# Patient Record
Sex: Male | Born: 2000 | Race: Black or African American | Hispanic: No | Marital: Single | Smoking: Never smoker
Health system: Southern US, Community
[De-identification: ages and names within clinical notes are randomized; demographics above are authoritative.]

---
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
## 2020-05-20 ENCOUNTER — Encounter (HOSPITAL_COMMUNITY): Payer: Self-pay

## 2020-05-20 ENCOUNTER — Other Ambulatory Visit: Payer: Self-pay

## 2020-05-20 ENCOUNTER — Emergency Department (HOSPITAL_COMMUNITY)
Admission: EM | Admit: 2020-05-20 | Discharge: 2020-05-20 | Disposition: A | Discharge status: Home / Self Care | Payer: Medicaid Other | Attending: Emergency Medicine | Admitting: Emergency Medicine

## 2020-05-20 DIAGNOSIS — R531 Weakness: Secondary | ICD-10-CM | POA: Insufficient documentation

## 2020-05-20 DIAGNOSIS — R0981 Nasal congestion: Secondary | ICD-10-CM | POA: Diagnosis not present

## 2020-05-20 DIAGNOSIS — R059 Cough, unspecified: Secondary | ICD-10-CM | POA: Diagnosis not present

## 2020-05-20 DIAGNOSIS — J029 Acute pharyngitis, unspecified: Secondary | ICD-10-CM | POA: Diagnosis not present

## 2020-05-20 DIAGNOSIS — Z20822 Contact with and (suspected) exposure to covid-19: Secondary | ICD-10-CM | POA: Insufficient documentation

## 2020-05-20 DIAGNOSIS — J069 Acute upper respiratory infection, unspecified: Secondary | ICD-10-CM

## 2020-05-20 LAB — POC SARS CORONAVIRUS 2 AG -  ED: SARS Coronavirus 2 Ag: NEGATIVE

## 2020-05-20 MED ORDER — AMOXICILLIN 500 MG PO CAPS: 500.0000 mg | ORAL_CAPSULE | Freq: Three times a day (TID) | ORAL | 0 refills | Status: DC

## 2020-05-20 NOTE — ED Provider Notes (Signed)
[facility]EMERGENCY DEPT Provider Note   CSN: [id number] Arrival date & time: 05/20/20  1552     History Chief Complaint  Patient presents with  . Nasal Congestion  . Weakness    Calvin Martinez is a 20 y.o. male.  Patient with nasal congestion and cough and sore throat.  The history is provided by medical records and the patient. No language interpreter was used.  Weakness Severity:  Mild Onset quality:  Sudden Timing:  Constant Progression:  Worsening Chronicity:  New Context: not alcohol use   Relieved by:  Nothing Worsened by:  Nothing Associated symptoms: no abdominal pain, no chest pain, no cough, no diarrhea, no frequency, no headaches and no seizures        History reviewed. No pertinent past medical history.  There are no problems to display for this patient.   History reviewed. No pertinent surgical history.     Family History  Problem Relation Age of Onset  . Healthy Mother   . Healthy Father     Social History   Tobacco Use  . Smoking status: Never Smoker  . Smokeless tobacco: Never Used  Vaping Use  . Vaping Use: Never used  Substance Use Topics  . Alcohol use: Never  . Drug use: Never    Home Medications Prior to Admission medications   Medication Sig Start Date End Date Taking? Authorizing Provider  amoxicillin (AMOXIL) 500 MG capsule Take 1 capsule (500 mg total) by mouth 3 (three) times daily. 05/20/20  Yes Bethann Berkshire, MD    Allergies    Patient has no known allergies.  Review of Systems   Review of Systems  Constitutional: Negative for appetite change and fatigue.  HENT: Positive for congestion. Negative for ear discharge and sinus pressure.        Sore throat  Eyes: Negative for discharge.  Respiratory: Negative for cough.   Cardiovascular: Negative for chest pain.  Gastrointestinal: Negative for abdominal pain and diarrhea.  Genitourinary: Negative for frequency and hematuria.   Musculoskeletal: Negative for back pain.  Skin: Negative for rash.  Neurological: Positive for weakness. Negative for seizures and headaches.  Psychiatric/Behavioral: Negative for hallucinations.    Physical Exam Updated Vital Signs BP 122/69   Pulse 60   Temp 98.9 F (37.2 C) (Oral)   Resp 18   Ht 6\' 2"  (1.88 m)   Wt 74.8 kg   SpO2 100%   BMI 21.18 kg/m   Physical Exam Vitals and nursing note reviewed.  Constitutional:      Appearance: He is well-developed.  HENT:     Head: Normocephalic.     Nose: Nose normal.  Eyes:     General: No scleral icterus.    Conjunctiva/sclera: Conjunctivae normal.  Neck:     Thyroid: No thyromegaly.  Cardiovascular:     Rate and Rhythm: Normal rate and regular rhythm.     Heart sounds: No murmur heard. No friction rub. No gallop.   Pulmonary:     Breath sounds: No stridor. No wheezing or rales.  Chest:     Chest wall: No tenderness.  Abdominal:     General: There is no distension.     Tenderness: There is no abdominal tenderness. There is no rebound.  Musculoskeletal:        General: Normal range of motion.     Cervical back: Neck supple.  Lymphadenopathy:     Cervical: No cervical adenopathy.  Skin:    Findings: No erythema  or rash.  Neurological:     Mental Status: He is alert and oriented to person, place, and time.     Motor: No abnormal muscle tone.     Coordination: Coordination normal.  Psychiatric:        Behavior: Behavior normal.     ED Results / Procedures / Treatments   Labs (all labs ordered are listed, but only abnormal results are displayed) Labs Reviewed  SARS CORONAVIRUS 2 (TAT 6-24 HRS)  POC SARS CORONAVIRUS 2 AG -  ED    EKG None  Radiology No results found.  Procedures Procedures   Medications Ordered in ED Medications - No data to display  ED Course  I have reviewed the triage vital signs and the nursing notes.  Pertinent labs & imaging results that were available during my care of  the patient were reviewed by me and considered in my medical decision making (see chart for details).    MDM Rules/Calculators/A&P                          Sinusitis.  Initial COVID test antigen negative.  We will do the 24-hour COVID.  Patient will follow up with me Final Clinical Impression(s) / ED Diagnoses Final diagnoses:  None    Rx / DC Orders ED Discharge Orders         Ordered    amoxicillin (AMOXIL) 500 MG capsule  3 times daily        05/20/20 1658           Bethann Berkshire, MD 05/20/20 1702

## 2020-05-20 NOTE — Discharge Instructions (Addendum)
During plenty of fluids take Tylenol for pain and follow-up as needed

## 2020-05-20 NOTE — ED Notes (Signed)
An After Visit Summary was printed and given to the patient. Discharge instructions given and no further questions at this time.  

## 2020-05-20 NOTE — ED Triage Notes (Signed)
Pt c/o lightheadedness and dizziness. Pt c/o nasal congestion. Pt states this started yesterday.

## 2020-05-21 LAB — SARS CORONAVIRUS 2 (TAT 6-24 HRS): SARS Coronavirus 2: NEGATIVE

## 2020-05-24 ENCOUNTER — Other Ambulatory Visit: Payer: Self-pay

## 2020-05-24 ENCOUNTER — Encounter (HOSPITAL_COMMUNITY): Payer: Self-pay | Admitting: *Deleted

## 2020-05-24 ENCOUNTER — Emergency Department (HOSPITAL_COMMUNITY)
Admission: EM | Admit: 2020-05-24 | Discharge: 2020-05-24 | Disposition: A | Discharge status: Home / Self Care | Payer: Medicaid Other | Attending: Emergency Medicine | Admitting: Emergency Medicine

## 2020-05-24 ENCOUNTER — Emergency Department (HOSPITAL_COMMUNITY): Payer: Medicaid Other

## 2020-05-24 DIAGNOSIS — R0602 Shortness of breath: Secondary | ICD-10-CM | POA: Insufficient documentation

## 2020-05-24 DIAGNOSIS — R5383 Other fatigue: Secondary | ICD-10-CM | POA: Diagnosis not present

## 2020-05-24 DIAGNOSIS — R42 Dizziness and giddiness: Secondary | ICD-10-CM | POA: Diagnosis present

## 2020-05-24 LAB — COMPREHENSIVE METABOLIC PANEL
ALT: 15 U/L (ref 0–44)
AST: 26 U/L (ref 15–41)
Albumin: 4.3 g/dL (ref 3.5–5.0)
Alkaline Phosphatase: 58 U/L (ref 38–126)
Anion gap: 8 (ref 5–15)
BUN: 16 mg/dL (ref 6–20)
CO2: 27 mmol/L (ref 22–32)
Calcium: 9 mg/dL (ref 8.9–10.3)
Chloride: 102 mmol/L (ref 98–111)
Creatinine, Ser: 0.79 mg/dL (ref 0.61–1.24)
GFR, Estimated: >60 mL/min (ref >60)
Glucose, Bld: 96 mg/dL (ref 70–99)
Potassium: 4.2 mmol/L (ref 3.5–5.1)
Sodium: 137 mmol/L (ref 135–145)
Total Bilirubin: 1 mg/dL (ref 0.3–1.2)
Total Protein: 7.2 g/dL (ref 6.5–8.1)

## 2020-05-24 LAB — CBC WITH DIFFERENTIAL/PLATELET
Abs Immature Granulocytes: 0 10*3/uL (ref 0.00–0.07)
Basophils Absolute: 0 10*3/uL (ref 0.0–0.1)
Basophils Relative: 0 %
Eosinophils Absolute: 0.1 10*3/uL (ref 0.0–0.5)
Eosinophils Relative: 1 %
HCT: 41.6 % (ref 39.0–52.0)
Hemoglobin: 13.3 g/dL (ref 13.0–17.0)
Immature Granulocytes: 0 %
Lymphocytes Relative: 28 %
Lymphs Abs: 1.5 10*3/uL (ref 0.7–4.0)
MCH: 26.4 pg (ref 26.0–34.0)
MCHC: 32 g/dL (ref 30.0–36.0)
MCV: 82.5 fL (ref 80.0–100.0)
Monocytes Absolute: 0.3 10*3/uL (ref 0.1–1.0)
Monocytes Relative: 7 %
Neutro Abs: 3.3 10*3/uL (ref 1.7–7.7)
Neutrophils Relative %: 64 %
Platelets: 206 10*3/uL (ref 150–400)
RBC: 5.04 MIL/uL (ref 4.22–5.81)
RDW: 13.3 % (ref 11.5–15.5)
WBC: 5.2 10*3/uL (ref 4.0–10.5)
nRBC: 0 % (ref 0.0–0.2)

## 2020-05-24 LAB — D-DIMER, QUANTITATIVE: D-Dimer, Quant: <0.27 ug/mL-FEU (ref 0.00–0.50)

## 2020-05-24 NOTE — ED Triage Notes (Addendum)
BIB EMS while at work felt dizzy and light headed. Seen on the 18th for congestion and same. Presently taking Amoxicillin. 113/71-56-16-99% RA CBG 102

## 2020-05-24 NOTE — ED Provider Notes (Signed)
[facility]-EMER[facility]PT Provider Note   CSN: [id number] Arrival date & time: 05/24/20  1224     History Chief Complaint  Patient presents with  . Dizziness    Calvin Martinez is a 20 y.o. male.  20 y.o male with no PMH presents to the ED via EMS with a chief complaint of dizziness x today. Patient was evaluated in the ED around 4 days ago and diagnosed with sinusitis, he was given a prescription for amoxicillin which he is currently taking. 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  Reports while he was in the EMS ambulance he did not feel he could gather his thoughts, had a hard time with his speech, felt like he was unaware of where he was not very confused.  Ports no prior symptoms like these in the past.  Denies any drug use, alcohol use, chest pain.  Does report he was placed on antibiotics about 4 days ago, he was having some shortness of breath and this has continued, this is worse with exertion.  No prior history of blood clots, no cough, no other symptoms.      The history is provided by the patient.       History reviewed. No pertinent past medical history.  There are no problems to display for this patient.   History reviewed. No pertinent surgical history.     Family History  Problem Relation Age of Onset  . Healthy Mother   . Healthy Father     Social History   Tobacco Use  . Smoking status: Never Smoker  . Smokeless tobacco: Never Used  Vaping Use  . Vaping Use: Never used  Substance Use Topics  . Alcohol use: Never  . Drug use: Never    Home Medications Prior to Admission medications   Medication Sig Start Date End Date Taking? Authorizing Provider  amoxicillin (AMOXIL) 500 MG capsule Take 1 capsule (500 mg total) by mouth 3 (three) times daily. 05/20/20   Bethann Berkshire, MD     Allergies    Patient has no known allergies.  Review of Systems   Review of Systems  Constitutional: Positive for fatigue. Negative for fever.  HENT: Positive for sinus pressure and sore throat.   Respiratory: Positive for shortness of breath.   Cardiovascular: Negative for chest pain.  Gastrointestinal: Negative for abdominal pain, diarrhea, nausea and vomiting.  All other systems reviewed and are negative.   Physical Exam Updated Vital Signs BP 105/73   Pulse (!) 52   Temp 98.1 F (36.7 C) (Oral)   Resp 14   Wt 74.9 kg   SpO2 97%   BMI 21.20 kg/m   Physical Exam Vitals and nursing note reviewed.  Constitutional:      Appearance: Normal appearance.  HENT:     Head: Normocephalic and atraumatic.     Mouth/Throat:     Mouth: Mucous membranes are moist.  Eyes:     Pupils: Pupils are equal, round, and reactive to light.  Cardiovascular:     Rate and Rhythm: Normal rate.  Abdominal:     General: Abdomen is flat.  Musculoskeletal:     Cervical back: Normal range of motion and neck supple.  Skin:    General: Skin is warm and dry.  Neurological:     Mental Status: He is alert and oriented to  person, place, and time.     Comments: Alert, oriented, thought content appropriate. Speech fluent without evidence of aphasia. Able to follow 2 step commands without difficulty.  Cranial Nerves:  II:  Peripheral visual fields grossly normal, pupils, round, reactive to light III,IV, VI: ptosis not present, extra-ocular motions intact bilaterally  V,VII: smile symmetric, facial light touch sensation equal VIII: hearing grossly normal bilaterally  IX,X: midline uvula rise  XI: bilateral shoulder shrug equal and strong XII: midline tongue extension  Motor:  5/5 in upper and lower extremities bilaterally including strong and equal grip strength and dorsiflexion/plantar flexion Sensory: light touch normal in all extremities.  Cerebellar: normal finger-to-nose with bilateral  upper extremities, pronator drift negative Gait: normal gait and balance     ED Results / Procedures / Treatments   Labs (all labs ordered are listed, but only abnormal results are displayed) Labs Reviewed  CBC WITH DIFFERENTIAL/PLATELET  COMPREHENSIVE METABOLIC PANEL  D-DIMER, QUANTITATIVE    EKG EKG Interpretation  Date/Time:  Friday May 24 2020 13:35:05 EDT Ventricular Rate:  55 PR Interval:  176 QRS Duration: 94 QT Interval:  416 QTC Calculation: 398 R Axis:   78 Text Interpretation: Sinus rhythm Probable left ventricular hypertrophy ST elevation suggests acute pericarditis 12 Lead; Mason-Likar Confirmed by Marianna Fuss ([id number]) on 05/24/2020 2:31:50 PM   Radiology DG Chest 2 View  Result Date: 05/24/2020 CLINICAL DATA:  Shortness of breath. Dizziness. Confusion. Slurred speech. EXAM: CHEST - 2 VIEW COMPARISON:  None. FINDINGS: The heart size and mediastinal contours are within normal limits. Both lungs are clear. No evidence of pneumothorax or pleural effusion. The visualized skeletal structures are unremarkable. IMPRESSION: Normal study. Electronically Signed   By: Danae Orleans M.D.   On: 05/24/2020 13:27    Procedures Procedures   Medications Ordered in ED Medications - No data to display  ED Course  I have reviewed the triage vital signs and the nursing notes.  Pertinent labs & imaging results that were available during my care of the patient were reviewed by me and considered in my medical decision making (see chart for details).  Clinical Course as of 05/24/20 1437  Fri May 24, 2020  1404 D-Dimer, Quant: <0.27 [JS]    Clinical Course User Index [JS] Claude Manges, PA-C   MDM Rules/Calculators/A&P     Patient presents to the ED via EMS with a chief complaint of dizziness, lightheadedness that began while he was at work.  Patient was originally seen 4 days ago in the ED, diagnosed with sinusitis and given a prescription for amoxicillin.  He reports he  has been taking this medication daily, has been feeling like he has had some improvement.  Today was the first that he return to work, he reports symptoms subsided as he sat down.  Does feel somewhat he was confused while on the EMS.  Arrived in the ED with stable vital signs, normotensive, heart rate is 50, oxygen saturation is 95% on room air without any signs of tachypnea.  Does report he has felt worsening shortness of breath the last couple of days since his last ED visit, exacerbated with exertion.  Due to this being a second visit we will obtain further labs in order to evaluate worsening symptoms from his sinusitis. Lungs are somewhat diminished to auscultation, abdomen is soft, non tender to palpation, neuro exam is unremarkable, without any focal deficit on my exam.  He is ambulatory in the room with a steady gait with stable vital signs.  Interpretation of his labs showed a CBC which is unremarkable. CMP is without any electrolyte abnormality, creatine level is within normal limits,LFTs are within normal limits. Due to persistent SOB despite normal xray on today's visits. Will obtain dimer, which is negative on today's visits.  Patient was reevaluate by me with mild improvement in symptoms, He is without any further complain, we discussed strict return precautions. Patient stable for discharge.    Portions of this note were generated with Scientist, clinical (histocompatibility and immunogenetics). Dictation errors may occur despite best attempts at proofreading.  Final Clinical Impression(s) / ED Diagnoses Final diagnoses:  Dizziness    Rx / DC Orders ED Discharge Orders    None       Claude Manges, PA-C 05/24/20 1437    Milagros Loll, MD 05/24/20 [phone]

## 2020-05-24 NOTE — Discharge Instructions (Addendum)
Please continue taking your antibiotics as prescribed. You may continue to hydrate with plenty of fluids if you are experiencing a fever you can also take tylenol.  Follow up with your primary care physician as needed.

## 2020-05-24 NOTE — ED Notes (Signed)
Patient transported to X-ray 

## 2020-06-03 ENCOUNTER — Emergency Department (HOSPITAL_COMMUNITY)
Admission: EM | Admit: 2020-06-03 | Discharge: 2020-06-03 | Disposition: A | Discharge status: Home / Self Care | Payer: Medicaid Other | Attending: Emergency Medicine | Admitting: Emergency Medicine

## 2020-06-03 ENCOUNTER — Encounter (HOSPITAL_COMMUNITY): Payer: Self-pay

## 2020-06-03 ENCOUNTER — Other Ambulatory Visit: Payer: Self-pay

## 2020-06-03 DIAGNOSIS — J069 Acute upper respiratory infection, unspecified: Secondary | ICD-10-CM

## 2020-06-03 DIAGNOSIS — U071 COVID-19: Secondary | ICD-10-CM | POA: Diagnosis not present

## 2020-06-03 DIAGNOSIS — R059 Cough, unspecified: Secondary | ICD-10-CM | POA: Diagnosis present

## 2020-06-03 NOTE — ED Provider Notes (Signed)
[facility]-EMERGENCY DEPT Provider Note   CSN: [id number] Arrival date & time: 06/03/20  1626     History Chief Complaint  Patient presents with  . Diarrhea    Calvin Martinez is a 20 y.o. male.  Calvin Martinez , a 20 y.o. male  was evaluated in triage.  Pt complains of mild headache with cough, congestion, sore throat.  States last night he felt hot and cold all night long.  Feeling better overall today.  No known sick contacts.        History reviewed. No pertinent past medical history.  There are no problems to display for this patient.   History reviewed. No pertinent surgical history.     Family History  Problem Relation Age of Onset  . Healthy Mother   . Healthy Father     Social History   Tobacco Use  . Smoking status: Never Smoker  . Smokeless tobacco: Never Used  Vaping Use  . Vaping Use: Never used  Substance Use Topics  . Alcohol use: Never  . Drug use: Never    Home Medications Prior to Admission medications   Medication Sig Start Date End Date Taking? Authorizing Provider  amoxicillin (AMOXIL) 500 MG capsule Take 1 capsule (500 mg total) by mouth 3 (three) times daily. 05/20/20   Bethann Berkshire, MD    Allergies    Patient has no known allergies.  Review of Systems   Review of Systems  Constitutional: Positive for chills and diaphoresis.  HENT: Positive for congestion and sore throat. Negative for sinus pressure, sinus pain and sneezing.   Respiratory: Positive for cough.   Gastrointestinal: Negative for nausea and vomiting.  Musculoskeletal: Positive for arthralgias and myalgias.  Skin: Negative for rash and wound.  Allergic/Immunologic: Negative for immunocompromised state.  Neurological: Positive for headaches.  Hematological: Negative for adenopathy.  Psychiatric/Behavioral: Negative for confusion.  All other systems reviewed and are negative.   Physical Exam Updated Vital Signs BP (!) 115/59 (BP  Location: Left Arm)   Pulse 65   Temp 98.9 F (37.2 C) (Oral)   Resp 16   SpO2 100%   Physical Exam Vitals and nursing note reviewed.  Constitutional:      General: He is not in acute distress.    Appearance: He is well-developed. He is not diaphoretic.  HENT:     Head: Normocephalic and atraumatic.  Eyes:     Conjunctiva/sclera: Conjunctivae normal.  Cardiovascular:     Rate and Rhythm: Normal rate and regular rhythm.     Pulses: Normal pulses.     Heart sounds: Normal heart sounds.  Pulmonary:     Effort: Pulmonary effort is normal.     Breath sounds: Normal breath sounds.  Abdominal:     Palpations: Abdomen is soft.     Tenderness: There is no abdominal tenderness.  Musculoskeletal:     Cervical back: Neck supple.  Lymphadenopathy:     Cervical: No cervical adenopathy.  Skin:    General: Skin is warm and dry.     Findings: No erythema or rash.  Neurological:     Mental Status: He is alert and oriented to person, place, and time.  Psychiatric:        Behavior: Behavior normal.     ED Results / Procedures / Treatments   Labs (all labs ordered are listed, but only abnormal results are displayed) Labs Reviewed  RESP PANEL BY RT-PCR (FLU A&B, COVID) [id number]    EKG  None  Radiology No results found.  Procedures Procedures   Medications Ordered in ED Medications - No data to display  ED Course  I have reviewed the triage vital signs and the nursing notes.  Pertinent labs & imaging results that were available during my care of the patient were reviewed by me and considered in my medical decision making (see chart for details).  Clinical Course as of 06/03/20 Carver Fila Jun 03, 2020  2614 20 year old male presents with URI symptoms, improving compared to illness last night.  Patient is well-appearing on exam.  Plan is for COVID swab and discharge to follow-up with his results in his MyChart account.  Recommend symptomatic treatment at home. [LM]    Clinical  Course User Index [LM] Alden Hipp   MDM Rules/Calculators/A&P                          Final Clinical Impression(s) / ED Diagnoses Final diagnoses:  Viral URI    Rx / DC Orders ED Discharge Orders    None       Alden Hipp 06/03/20 Lenon Oms, MD 06/04/20 1031

## 2020-06-03 NOTE — Discharge Instructions (Addendum)
Home to quarantine pending COVID test results. Take Motrin and Tylenol as needed as directed.

## 2020-06-03 NOTE — ED Triage Notes (Signed)
Emergency Medicine Provider Triage Evaluation Note  Calvin Martinez Calvin Martinez , a 20 y.o. male  was evaluated in triage.  Pt complains of mild headache with cough, congestion, sore throat.  States last night he felt hot and cold all night long.  Feeling better overall today.  No known sick contacts.  Review of Systems  Positive: Fever, cough, congestion, sore throat Negative: Abdominal pain, vomiting, changes in bowel or bladder habits  Physical Exam  BP (!) 115/59 (BP Location: Left Arm)   Pulse 65   Temp 98.9 F (37.2 C) (Oral)   Resp 16   SpO2 100%  Gen:   Awake, no distress   HEENT:  Atraumatic  Resp:  Normal effort  Cardiac:  Normal rate  Abd:   Nondistended, nontender  MSK:   Moves extremities without difficulty  Neuro:  Speech clear   Medical Decision Making  Medically screening exam initiated at 5:21 PM.  Appropriate orders placed.  Calvin Martinez was informed that the remainder of the evaluation will be completed by another provider, this initial triage assessment does not replace that evaluation, and the importance of remaining in the ED until their evaluation is complete.  Clinical Impression     Calvin Fend, PA-C 06/03/20 1724

## 2020-06-03 NOTE — ED Triage Notes (Signed)
Pt reports diarrhea since last night. Reports thinking that he may have had a fever. Denies abdominal pain and vomiting.

## 2020-06-04 LAB — RESP PANEL BY RT-PCR (FLU A&B, COVID) ARPGX2
Influenza A by PCR: NEGATIVE
Influenza B by PCR: NEGATIVE
SARS Coronavirus 2 by RT PCR: POSITIVE — AB

## 2020-06-05 ENCOUNTER — Telehealth: Payer: Self-pay | Admitting: Unknown Physician Specialty

## 2020-06-05 NOTE — Telephone Encounter (Signed)
Called to discuss with patient about COVID-19 symptoms and the use of one of the available treatments for those with mild to moderate Covid symptoms and at a high risk of hospitalization.  Pt appears to qualify for outpatient treatment due to co-morbid conditions and/or a member of an at-risk group in accordance with the FDA Emergency Use Authorization.    Not interested in tx at this time  Calvin Martinez

## 2021-07-01 IMAGING — CR DG CHEST 2V
2 series · 2 of 2 positions shown · non-contrast
Comparison: None.

CLINICAL DATA: Shortness of breath. Dizziness. Confusion. Slurred
speech.

EXAM:
CHEST - 2 VIEW

[w chest lat]
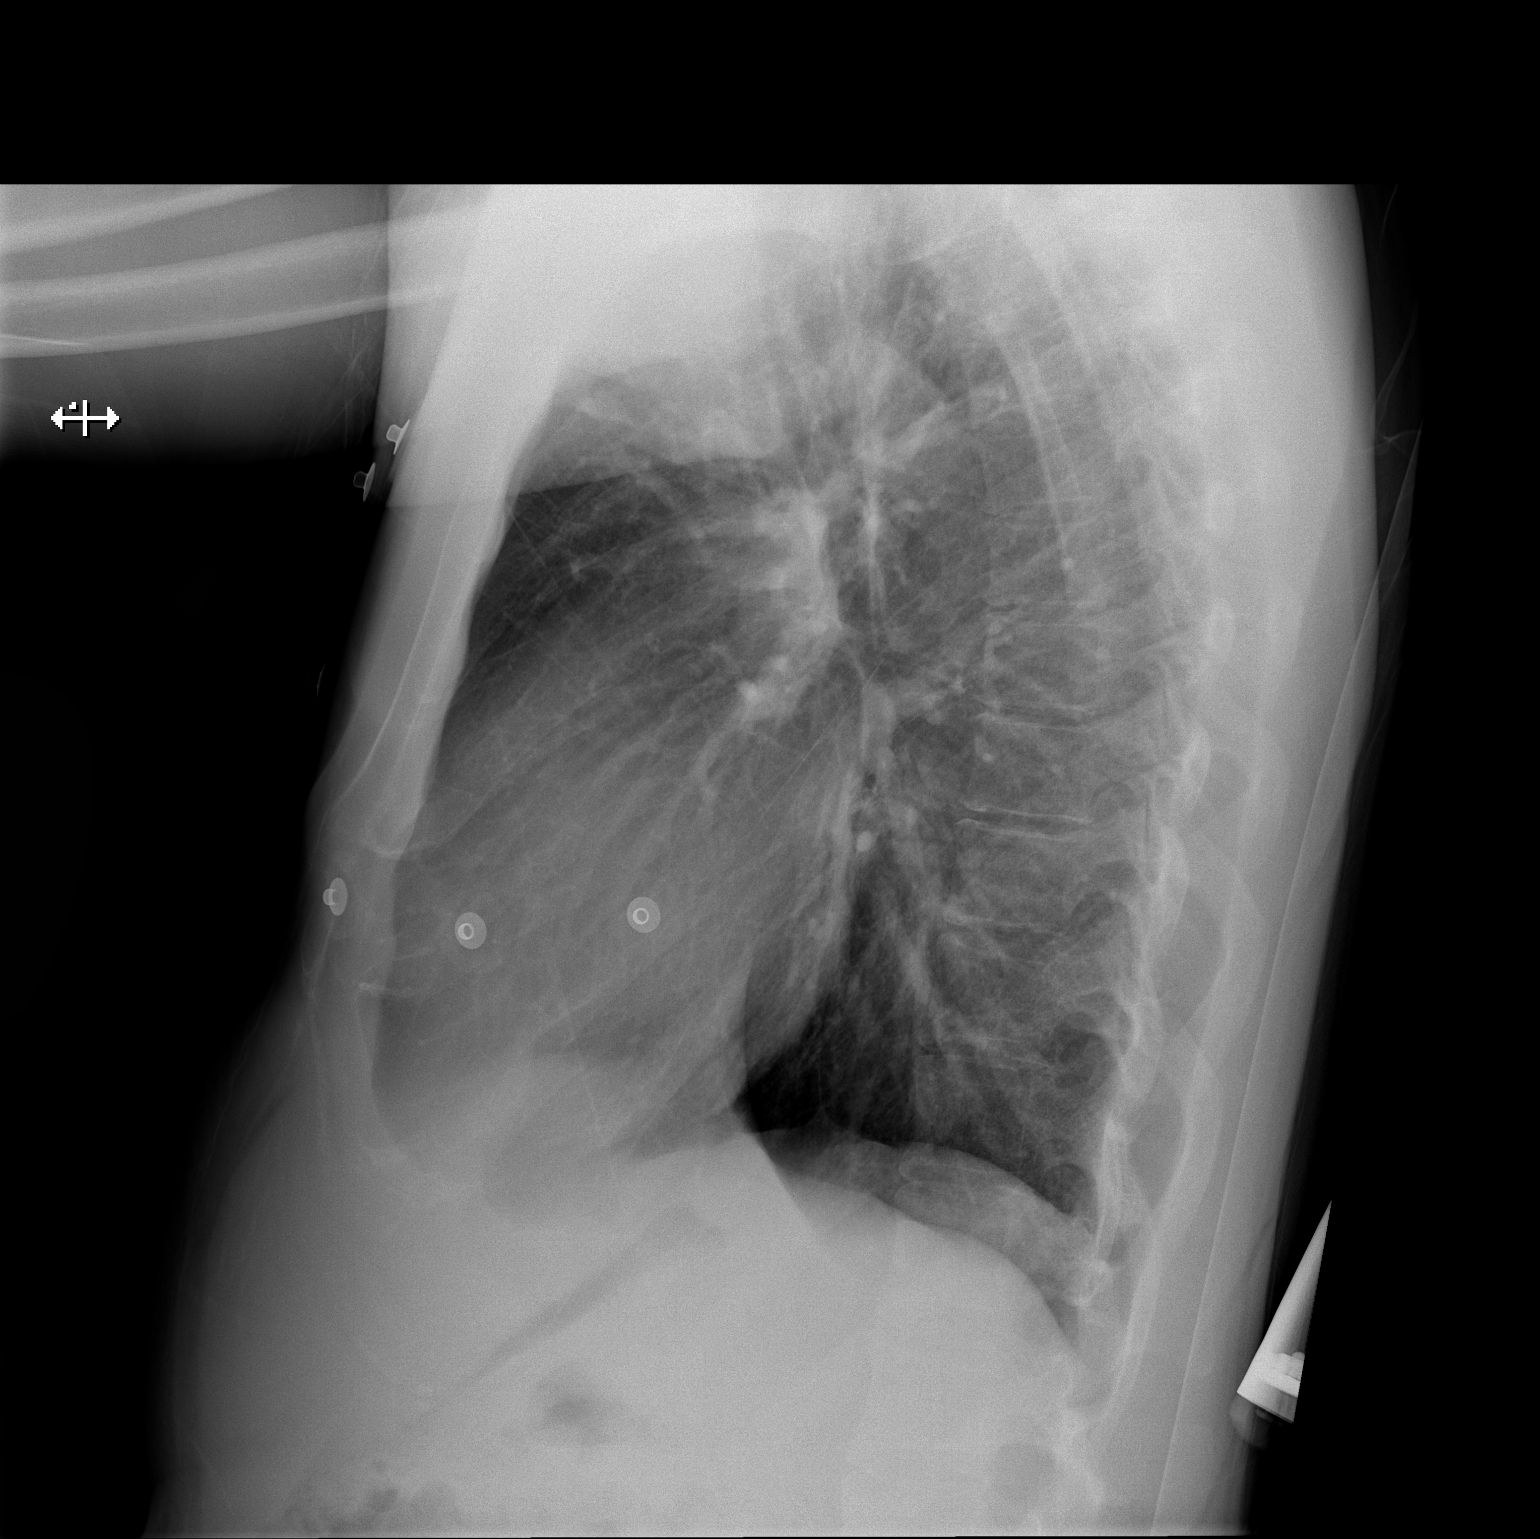

[x chest ap]
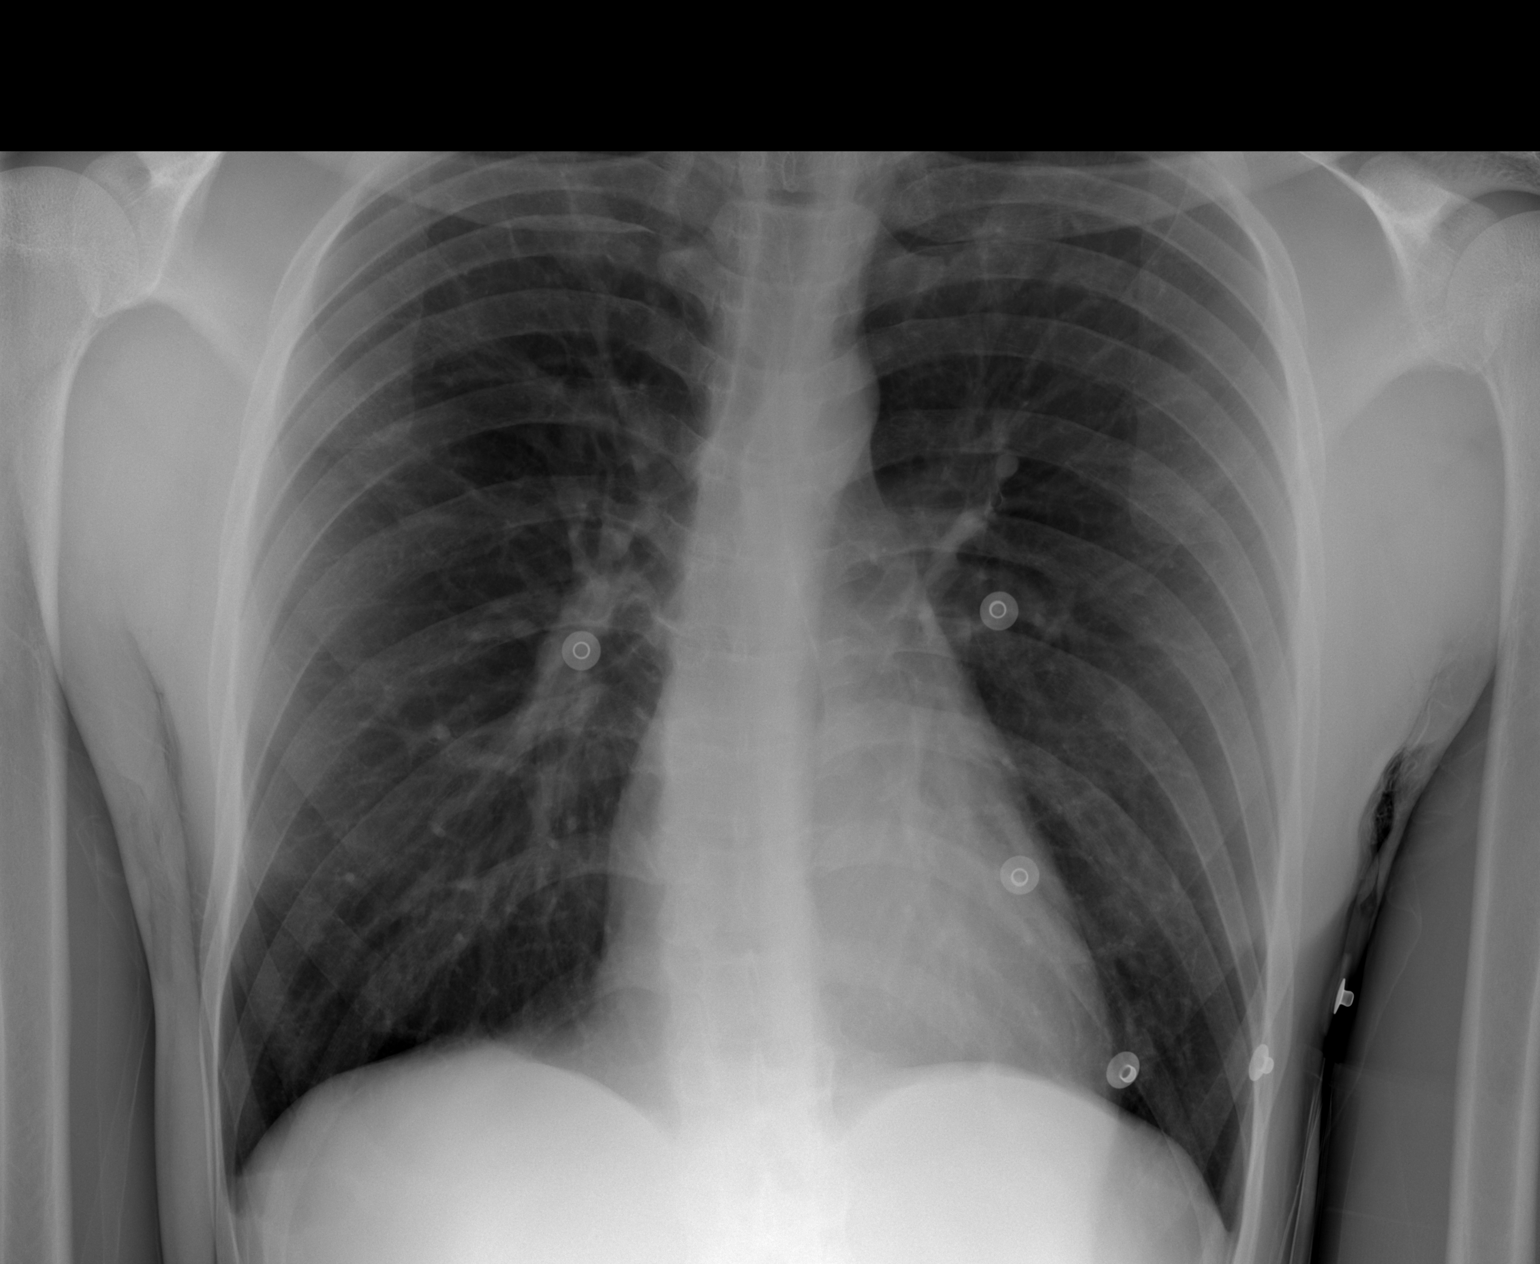

[2 of 2 positions shown; findings below may reference images not displayed]

FINDINGS: The heart size and mediastinal contours are within normal limits.
Both lungs are clear. No evidence of pneumothorax or pleural
effusion. The visualized skeletal structures are unremarkable.
IMPRESSION: Normal study.

## 2022-04-17 ENCOUNTER — Encounter (HOSPITAL_COMMUNITY): Payer: Self-pay

## 2022-04-17 ENCOUNTER — Ambulatory Visit (HOSPITAL_COMMUNITY)
Admission: EM | Admit: 2022-04-17 | Discharge: 2022-04-17 | Disposition: A | Discharge status: Home / Self Care | Payer: Medicaid Other | Attending: Internal Medicine | Admitting: Internal Medicine

## 2022-04-17 DIAGNOSIS — J029 Acute pharyngitis, unspecified: Secondary | ICD-10-CM

## 2022-04-17 DIAGNOSIS — Z1152 Encounter for screening for COVID-19: Secondary | ICD-10-CM | POA: Insufficient documentation

## 2022-04-17 DIAGNOSIS — J019 Acute sinusitis, unspecified: Secondary | ICD-10-CM | POA: Insufficient documentation

## 2022-04-17 DIAGNOSIS — B9689 Other specified bacterial agents as the cause of diseases classified elsewhere: Secondary | ICD-10-CM | POA: Diagnosis not present

## 2022-04-17 LAB — POCT RAPID STREP A, ED / UC: Streptococcus, Group A Screen (Direct): NEGATIVE

## 2022-04-17 LAB — POC INFLUENZA A AND B ANTIGEN (URGENT CARE ONLY)
INFLUENZA A ANTIGEN, POC: NEGATIVE
INFLUENZA B ANTIGEN, POC: NEGATIVE

## 2022-04-17 MED ORDER — FLUTICASONE PROPIONATE 50 MCG/ACT NA SUSP: 1.0000 | Freq: Every day | NASAL | 0 refills | Status: DC | PRN

## 2022-04-17 MED ORDER — CETIRIZINE HCL 10 MG PO TABS: 10.0000 mg | ORAL_TABLET | Freq: Every day | ORAL | 0 refills | Status: DC | PRN

## 2022-04-17 NOTE — Discharge Instructions (Signed)
Your flu test was negative. COVID was sent to the lab for further testing. Someone from our office will call you with your results.Your symptoms appear consistent with a viral respiratory illness. Recommend symptomatic treatment with nasal spray (Flonase) for congestion and Tylenol/Ibuprofen as needed for fevers/aches. Return in 2 to 3 days if no improvement. If new or worsening symptoms such as presistent fevers, difficulty breathing, shortness of breath, or chest pain, go directly to the ER.

## 2022-04-17 NOTE — ED Triage Notes (Signed)
Dizziness, fatigue, sore throat onset 2 days ago. Patient has chills and sweats at times. Headache and runny nose as well. No known sick exposure. No meds tried.

## 2022-04-17 NOTE — ED Provider Notes (Signed)
[facility]   Note:  This document was prepared using Dragon voice recognition software and may include unintentional dictation errors.  MRN: [id number] DOB: 04/30/2000 DATE: 04/17/22   Subjective:  Chief Complaint:  Chief Complaint  Patient presents with   Sore Throat   Nasal Congestion     HPI: Calvin Martinez is a 22 y.o. male presenting for nasal congestion and sore throat for the past 2 days.  No known sick contacts.  He reports headache and fatigue.  He has not tried anything over-the-counter.  Denies fever, nausea/vomiting, abdominal pain, cough. Endorses chills. Presents NAD.  Prior to Admission medications   Not on File     No Known Allergies  History:   History reviewed. No pertinent past medical history.   History reviewed. No pertinent surgical history.  Family History  Problem Relation Age of Onset   Healthy Mother    Healthy Father     Social History   Tobacco Use   Smoking status: Never   Smokeless tobacco: Never  Vaping Use   Vaping Use: Never used  Substance Use Topics   Alcohol use: Never   Drug use: Never    Review of Systems  Constitutional:  Positive for chills and fatigue. Negative for fever.  HENT:  Positive for congestion, rhinorrhea, sinus pressure and sore throat. Negative for ear pain.   Respiratory:  Negative for cough.   Gastrointestinal:  Negative for abdominal pain, nausea and vomiting.  Neurological:  Positive for headaches.     Objective:   Vitals: BP 119/75 (BP Location: Right Arm)   Pulse 74   Temp 99.2 F (37.3 C) (Oral)   Resp 16   Ht 6\' 2"  (1.88 m)   Wt 170 lb (77.1 kg)   SpO2 98%   BMI 21.83 kg/m   Physical Exam Vitals and nursing note reviewed.  Constitutional:      General: He is not in acute distress.    Appearance: Normal appearance. He is well-developed and normal weight. He is not ill-appearing or toxic-appearing.  HENT:     Head: Normocephalic and atraumatic.     Right Ear:  Tympanic membrane and ear canal normal.     Left Ear: Tympanic membrane and ear canal normal.     Nose:     Right Turbinates: Enlarged.     Left Turbinates: Enlarged.     Mouth/Throat:     Pharynx: Uvula midline. Posterior oropharyngeal erythema present.     Tonsils: No tonsillar exudate or tonsillar abscesses.  Eyes:     Conjunctiva/sclera: Conjunctivae normal.  Cardiovascular:     Rate and Rhythm: Normal rate and regular rhythm.     Heart sounds: Normal heart sounds.  Pulmonary:     Effort: Pulmonary effort is normal.     Breath sounds: Normal breath sounds.     Comments: Clear to auscultation bilaterally  Abdominal:     General: Bowel sounds are normal.     Palpations: Abdomen is soft.     Tenderness: There is no abdominal tenderness.  Musculoskeletal:        General: No swelling.     Cervical back: Neck supple.  Skin:    General: Skin is warm and dry.  Neurological:     General: No focal deficit present.     Mental Status: He is alert.  Psychiatric:        Mood and Affect: Mood and affect normal.     Results:  Labs: Results for orders  placed or performed during the hospital encounter of 04/17/22 (from the past 24 hour(s))  POCT Rapid Strep A     Status: None   Collection Time: 04/17/22  7:40 PM  Result Value Ref Range   Streptococcus, Group A Screen (Direct) NEGATIVE NEGATIVE  POC Influenza A & B Ag (Urgent Care)     Status: None   Collection Time: 04/17/22  7:50 PM  Result Value Ref Range   INFLUENZA A ANTIGEN, POC NEGATIVE NEGATIVE   INFLUENZA B ANTIGEN, POC NEGATIVE NEGATIVE    Radiology: No results found.   UC Course/Treatments:  Procedures: Procedures   Medications Ordered in UC: Medications - No data to display   Assessment and Plan :     ICD-10-CM   1. Acute non-recurrent sinusitis, unspecified location  J01.90     2. Acute pharyngitis, unspecified etiology  J02.9       Acute non-recurrent sinusitis, unspecified location Afebrile,  nontoxic-appearing, NAD. VSS. DDX includes but not limited to: Viral sinusitis, COVID, flu Flu was negative in office today.  COVID is pending.  Suspect viral sinusitis.  Flonase and Zyrtec were prescribed for symptomatic treatment. Strict ED precautions were given and patient verbalized understanding.  Acute pharyngitis, unspecified etiology Afebrile, nontoxic-appearing, NAD. VSS. DDX includes but not limited to: Viral pharyngitis, COVID, strep Strep was negative in office today.  COVID is pending.  Suspect viral etiology.  Flonase and Zyrtec were prescribed for symptomatic treatment. Strict ED precautions were given and patient verbalized understanding.    ED Discharge Orders          Ordered    fluticasone (FLONASE) 50 MCG/ACT nasal spray  Daily PRN        04/17/22 2005    cetirizine (ZYRTEC) 10 MG tablet  Daily PRN        04/17/22 2005             PDMP not reviewed this encounter.     Carmie End, PA-C 04/17/22 2008

## 2022-04-18 LAB — SARS CORONAVIRUS 2 (TAT 6-24 HRS): SARS Coronavirus 2: NEGATIVE

## 2022-09-17 ENCOUNTER — Encounter (HOSPITAL_COMMUNITY): Payer: Self-pay

## 2022-09-17 ENCOUNTER — Ambulatory Visit (HOSPITAL_COMMUNITY)
Admission: EM | Admit: 2022-09-17 | Discharge: 2022-09-17 | Disposition: A | Discharge status: Home / Self Care | Payer: Medicaid Other | Attending: Physician Assistant | Admitting: Physician Assistant

## 2022-09-17 DIAGNOSIS — R1010 Upper abdominal pain, unspecified: Secondary | ICD-10-CM | POA: Diagnosis present

## 2022-09-17 DIAGNOSIS — R112 Nausea with vomiting, unspecified: Secondary | ICD-10-CM | POA: Insufficient documentation

## 2022-09-17 DIAGNOSIS — R197 Diarrhea, unspecified: Secondary | ICD-10-CM | POA: Diagnosis present

## 2022-09-17 LAB — CBC WITH DIFFERENTIAL/PLATELET
Abs Immature Granulocytes: 0.01 10*3/uL (ref 0.00–0.07)
Basophils Absolute: 0 10*3/uL (ref 0.0–0.1)
Basophils Relative: 1 %
Eosinophils Absolute: 0 10*3/uL (ref 0.0–0.5)
Eosinophils Relative: 1 %
HCT: 47.9 % (ref 39.0–52.0)
Hemoglobin: 15.7 g/dL (ref 13.0–17.0)
Immature Granulocytes: 0 %
Lymphocytes Relative: 41 %
Lymphs Abs: 1.6 10*3/uL (ref 0.7–4.0)
MCH: 26.2 pg (ref 26.0–34.0)
MCHC: 32.8 g/dL (ref 30.0–36.0)
MCV: 80 fL (ref 80.0–100.0)
Monocytes Absolute: 0.6 10*3/uL (ref 0.1–1.0)
Monocytes Relative: 15 %
Neutro Abs: 1.6 10*3/uL — ABNORMAL LOW (ref 1.7–7.7)
Neutrophils Relative %: 42 %
Platelets: 211 10*3/uL (ref 150–400)
RBC: 5.99 MIL/uL — ABNORMAL HIGH (ref 4.22–5.81)
RDW: 13.1 % (ref 11.5–15.5)
WBC: 3.8 10*3/uL — ABNORMAL LOW (ref 4.0–10.5)
nRBC: 0 % (ref 0.0–0.2)

## 2022-09-17 LAB — COMPREHENSIVE METABOLIC PANEL
ALT: 32 U/L (ref 0–44)
AST: 55 U/L — ABNORMAL HIGH (ref 15–41)
Albumin: 4.6 g/dL (ref 3.5–5.0)
Alkaline Phosphatase: 62 U/L (ref 38–126)
Anion gap: 13 (ref 5–15)
BUN: 18 mg/dL (ref 6–20)
CO2: 28 mmol/L (ref 22–32)
Calcium: 9.6 mg/dL (ref 8.9–10.3)
Chloride: 96 mmol/L — ABNORMAL LOW (ref 98–111)
Creatinine, Ser: 0.94 mg/dL (ref 0.61–1.24)
GFR, Estimated: >60 mL/min (ref >60)
Glucose, Bld: 77 mg/dL (ref 70–99)
Potassium: 3.7 mmol/L (ref 3.5–5.1)
Sodium: 137 mmol/L (ref 135–145)
Total Bilirubin: 2.6 mg/dL — ABNORMAL HIGH (ref 0.3–1.2)
Total Protein: 7.8 g/dL (ref 6.5–8.1)

## 2022-09-17 LAB — POCT URINALYSIS DIP (MANUAL ENTRY)
Glucose, UA: NEGATIVE mg/dL
Leukocytes, UA: NEGATIVE
Nitrite, UA: NEGATIVE
Protein Ur, POC: 30 mg/dL — AB
Spec Grav, UA: 1.025 (ref 1.010–1.025)
Urobilinogen, UA: 1 E.U./dL
pH, UA: 6.5 (ref 5.0–8.0)

## 2022-09-17 LAB — LIPASE, BLOOD: Lipase: 23 U/L (ref 11–51)

## 2022-09-17 MED ORDER — ONDANSETRON 4 MG PO TBDP: 4.0000 mg | ORAL_TABLET | Freq: Three times a day (TID) | ORAL | 0 refills | Status: DC | PRN

## 2022-09-17 MED ORDER — SUCRALFATE 1 G PO TABS: 1.0000 g | ORAL_TABLET | Freq: Three times a day (TID) | ORAL | 0 refills | Status: DC

## 2022-09-17 NOTE — ED Triage Notes (Signed)
Pt states had too much to drink Friday night and had vomit all his food/alcohol up. States ever sense he has felt weak with no appetite.

## 2022-09-17 NOTE — Discharge Instructions (Signed)
Your urine does appear that you have not been eating and drinking normally but was not significantly dehydrated.  It is very important that you push fluids.  Use Zofran every 8 hours as needed for nausea symptoms.  I am also concerned that the alcohol upset the lining of your stomach.  Use sucralfate before each meal and before bed for the next week.  Eat a bland diet and avoid alcohol, NSAIDs (aspirin, ibuprofen/Advil, naproxen/Aleve).  I will contact you if any of your lab work is abnormal.  Follow-up with your primary care next week.  If anything worsens and you have increasing nausea/vomiting despite medication, recurrent abdominal pain, blood in your stool, blood in your vomit, weakness, confusion you need to be seen emergently.

## 2022-09-17 NOTE — ED Provider Notes (Signed)
[facility]    CSN: [id number] Arrival date & time: 09/17/22  1453      History   Chief Complaint Chief Complaint  Patient presents with   Weakness    HPI Calvin Martinez is a 22 y.o. male.   Patient presents today with a 5 to 6-day history of generalized weakness.  Reports that last weekend on Friday (09/11/2022) he went out drinking and drink too much.  He has had ongoing GI symptoms including nausea, vomiting, diarrhea for several days.  This is since resolved and he has been able to eat and drink but just feels tired and weak.  He denies any chest pain, shortness of breath, palpitations, headache, focal weakness.  He denies any melena, hematochezia, hematemesis.  He has been able to eat and drink but has had little appetite up until the past few hours so does report that he has had a decrease in his oral intake.  He has not had any additional alcohol and denies additional drug use.  He has not tried any over-the-counter medication for symptom management.    History reviewed. No pertinent past medical history.  There are no problems to display for this patient.   History reviewed. No pertinent surgical history.     Home Medications    Prior to Admission medications   Medication Sig Start Date End Date Taking? Authorizing Provider  ondansetron (ZOFRAN-ODT) 4 MG disintegrating tablet Take 1 tablet (4 mg total) by mouth every 8 (eight) hours as needed for nausea or vomiting. 09/17/22  Yes Alanah Sakuma K, PA-C  sucralfate (CARAFATE) 1 g tablet Take 1 tablet (1 g total) by mouth 4 (four) times daily -  with meals and at bedtime. 09/17/22  Yes Atheena Spano, Denny Peon K, PA-C  cetirizine (ZYRTEC) 10 MG tablet Take 1 tablet (10 mg total) by mouth daily as needed for allergies or rhinitis. 04/17/22   Hermanns, Ashlee P, PA-C  fluticasone (FLONASE) 50 MCG/ACT nasal spray Place 1 spray into both nostrils daily as needed for allergies or rhinitis. 04/17/22   Hermanns, Ashlee P, PA-C     Family History Family History  Problem Relation Age of Onset   Healthy Mother    Healthy Father     Social History Social History   Tobacco Use   Smoking status: Never   Smokeless tobacco: Never  Vaping Use   Vaping status: Never Used  Substance Use Topics   Alcohol use: Yes    Comment: occ   Drug use: Never     Allergies   Patient has no known allergies.   Review of Systems Review of Systems  Constitutional:  Positive for activity change and fatigue. Negative for appetite change and fever.  Respiratory:  Negative for shortness of breath.   Cardiovascular:  Negative for chest pain.  Gastrointestinal:  Negative for abdominal pain, diarrhea (Resolved), nausea (Resolved) and vomiting (Resolved).  Musculoskeletal:  Negative for arthralgias and myalgias.  Neurological:  Positive for weakness (generalized). Negative for dizziness, light-headedness, numbness and headaches.     Physical Exam Triage Vital Signs ED Triage Vitals  Encounter Vitals Group     BP 09/17/22 1705 122/69     Systolic BP Percentile --      Diastolic BP Percentile --      Pulse Rate 09/17/22 1705 64     Resp 09/17/22 1705 16     Temp 09/17/22 1705 98.3 F (36.8 C)     Temp Source 09/17/22 1705 Oral  SpO2 09/17/22 1705 98 %     Weight --      Height --      Head Circumference --      Peak Flow --      Pain Score 09/17/22 1706 0     Pain Loc --      Pain Education --      Exclude from Growth Chart --    No data found.  Updated Vital Signs BP 122/69 (BP Location: Right Arm)   Pulse 64   Temp 98.3 F (36.8 C) (Oral)   Resp 16   SpO2 98%   Visual Acuity Right Eye Distance:   Left Eye Distance:   Bilateral Distance:    Right Eye Near:   Left Eye Near:    Bilateral Near:     Physical Exam Vitals reviewed.  Constitutional:      General: He is awake.     Appearance: Normal appearance. He is well-developed. He is not ill-appearing.     Comments: Very pleasant male appears  stated age in no acute distress sitting comfortably in exam room  HENT:     Head: Normocephalic and atraumatic.     Mouth/Throat:     Mouth: Mucous membranes are moist.     Pharynx: Uvula midline. No oropharyngeal exudate, posterior oropharyngeal erythema or uvula swelling.  Cardiovascular:     Rate and Rhythm: Normal rate and regular rhythm.     Heart sounds: Normal heart sounds, S1 normal and S2 normal. No murmur heard. Pulmonary:     Effort: Pulmonary effort is normal.     Breath sounds: Normal breath sounds. No stridor. No wheezing, rhonchi or rales.     Comments: Clear to auscultation bilaterally Abdominal:     General: Bowel sounds are normal.     Palpations: Abdomen is soft.     Tenderness: There is no abdominal tenderness. There is no right CVA tenderness, left CVA tenderness, guarding or rebound.     Comments: Benign abdominal exam  Neurological:     Mental Status: He is alert.  Psychiatric:        Behavior: Behavior is cooperative.      UC Treatments / Results  Labs (all labs ordered are listed, but only abnormal results are displayed) Labs Reviewed  POCT URINALYSIS DIP (MANUAL ENTRY) - Abnormal; Notable for the following components:      Result Value   Color, UA straw (*)    Clarity, UA cloudy (*)    Bilirubin, UA small (*)    Ketones, POC UA small (15) (*)    Blood, UA trace-intact (*)    Protein Ur, POC =30 (*)    All other components within normal limits  CBC WITH DIFFERENTIAL/PLATELET  COMPREHENSIVE METABOLIC PANEL  LIPASE, BLOOD    EKG   Radiology No results found.  Procedures Procedures (including critical care time)  Medications Ordered in UC Medications - No data to display  Initial Impression / Assessment and Plan / UC Course  I have reviewed the triage vital signs and the nursing notes.  Pertinent labs & imaging results that were available during my care of the patient were reviewed by me and considered in my medical decision making (see  chart for details).     Patient is well-appearing, afebrile, nontoxic, nontachycardic.  Vital signs and physical exam are reassuring with no indication for emergent evaluation or imaging.  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  Urine was obtained that showed decreased oral intake but was not significantly dehydrated so fluids were deferred.  Basic labs including CBC, CMP, lipase were obtained and are pending.  We will contact him if they are positive.  He was given Zofran to help manage nausea symptoms and started on sucralfate to manage suspected gastritis.  Discussed that he is to eat a bland diet and avoid spicy/acidic/fatty foods.  He is avoid alcohol and NSAIDs.  Discussed that if his symptoms are improving within a few days he is to return for reevaluation.  If he has any worsening or changing symptoms including abdominal pain, recurrent nausea/vomiting despite medication, fever, melena, hematochezia, weakness.  Strict return precautions given.  Work excuse note provided.  Recommend close follow-up to ensure symptom improvement and if he is unable to see primary care he can return here.  Final Clinical Impressions(s) / UC Diagnoses   Final diagnoses:  Nausea vomiting and diarrhea  Upper abdominal pain     Discharge Instructions      Your urine does appear that you have not been eating and drinking normally but was not significantly dehydrated.  It is very important that you push fluids.  Use Zofran every 8 hours as needed for nausea symptoms.  I am also concerned that the alcohol upset the lining of your stomach.  Use sucralfate before each meal and before bed for the next week.  Eat a bland diet and avoid alcohol, NSAIDs (aspirin, ibuprofen/Advil, naproxen/Aleve).  I will contact you if any of your lab work is abnormal.  Follow-up with your primary care next week.  If  anything worsens and you have increasing nausea/vomiting despite medication, recurrent abdominal pain, blood in your stool, blood in your vomit, weakness, confusion you need to be seen emergently.     ED Prescriptions     Medication Sig Dispense Auth. Provider   ondansetron (ZOFRAN-ODT) 4 MG disintegrating tablet Take 1 tablet (4 mg total) by mouth every 8 (eight) hours as needed for nausea or vomiting. 20 tablet Matisyn Cabeza K, PA-C   sucralfate (CARAFATE) 1 g tablet Take 1 tablet (1 g total) by mouth 4 (four) times daily -  with meals and at bedtime. 28 tablet Keyara Ent, Noberto Retort, PA-C      PDMP not reviewed this encounter.   Jeani Hawking, PA-C 09/17/22 9528

## 2022-09-29 ENCOUNTER — Ambulatory Visit (HOSPITAL_COMMUNITY)
Admission: EM | Admit: 2022-09-29 | Discharge: 2022-09-29 | Disposition: A | Discharge status: Home / Self Care | Payer: Medicaid Other | Attending: Internal Medicine | Admitting: Internal Medicine

## 2022-09-29 ENCOUNTER — Encounter (HOSPITAL_COMMUNITY): Payer: Self-pay

## 2022-09-29 DIAGNOSIS — R112 Nausea with vomiting, unspecified: Secondary | ICD-10-CM | POA: Insufficient documentation

## 2022-09-29 DIAGNOSIS — R197 Diarrhea, unspecified: Secondary | ICD-10-CM | POA: Insufficient documentation

## 2022-09-29 DIAGNOSIS — B349 Viral infection, unspecified: Secondary | ICD-10-CM | POA: Diagnosis not present

## 2022-09-29 DIAGNOSIS — Z1152 Encounter for screening for COVID-19: Secondary | ICD-10-CM | POA: Diagnosis not present

## 2022-09-29 LAB — POCT INFLUENZA A/B
Influenza A, POC: NEGATIVE
Influenza B, POC: NEGATIVE

## 2022-09-29 MED ORDER — ACETAMINOPHEN 325 MG PO TABS
650.0000 mg | ORAL_TABLET | Freq: Once | ORAL | Status: AC
  Administered 2022-09-29: 650 mg via ORAL

## 2022-09-29 MED ORDER — ACETAMINOPHEN 325 MG PO TABS
ORAL_TABLET | ORAL | Status: AC
  Filled 2022-09-29: qty 2

## 2022-09-29 MED ORDER — ONDANSETRON 4 MG PO TBDP
4.0000 mg | ORAL_TABLET | Freq: Once | ORAL | Status: AC
  Administered 2022-09-29: 4 mg via ORAL

## 2022-09-29 MED ORDER — ONDANSETRON 4 MG PO TBDP
ORAL_TABLET | ORAL | Status: AC
  Filled 2022-09-29: qty 1

## 2022-09-29 NOTE — Discharge Instructions (Addendum)
Take Zofran as needed for nausea Recommend Imodium as needed for diarrhea, this is over the counter Drink plenty of fluids, recommend pedialyte or gatorade Take Tylenol as needed for fever Return if you cannot keep fluids down.

## 2022-09-29 NOTE — ED Triage Notes (Signed)
Pt reports he has chills, fever, diarrhea, and dizzy, no appetite x 2 weeks.

## 2022-09-29 NOTE — ED Provider Notes (Signed)
[facility]    CSN: [id number] Arrival date & time: 09/29/22  1636      History   Chief Complaint No chief complaint on file.   HPI Calvin Martinez is a 22 y.o. male.   Patient seen about 2 weeks ago for nausea, vomiting after increased alcohol intake.  Zofran prescribed.  He reports he was feeling somewhat better until Saturday his symptoms became worse.  He is now experiencing fever, body aches, chills, diarrhea, decreased appetite.  He denies vomiting at this time.  He is taking Zofran with some relief.  He has tried no other medications at home.  Reports he is able to drink fluids without difficulty.  Denies cough, congestion, shortness of breath, wheezing.    History reviewed. No pertinent past medical history.  There are no problems to display for this patient.   History reviewed. No pertinent surgical history.     Home Medications    Prior to Admission medications   Medication Sig Start Date End Date Taking? Authorizing Provider  cetirizine (ZYRTEC) 10 MG tablet Take 1 tablet (10 mg total) by mouth daily as needed for allergies or rhinitis. 04/17/22   Hermanns, Ashlee P, PA-C  fluticasone (FLONASE) 50 MCG/ACT nasal spray Place 1 spray into both nostrils daily as needed for allergies or rhinitis. 04/17/22   Hermanns, Ashlee P, PA-C  ondansetron (ZOFRAN-ODT) 4 MG disintegrating tablet Take 1 tablet (4 mg total) by mouth every 8 (eight) hours as needed for nausea or vomiting. 09/17/22   Raspet, Denny Peon K, PA-C  sucralfate (CARAFATE) 1 g tablet Take 1 tablet (1 g total) by mouth 4 (four) times daily -  with meals and at bedtime. 09/17/22   Raspet, Noberto Retort, PA-C    Family History Family History  Problem Relation Age of Onset   Healthy Mother    Healthy Father     Social History Social History   Tobacco Use   Smoking status: Never   Smokeless tobacco: Never  Vaping Use   Vaping status: Never Used  Substance Use Topics   Alcohol use: Yes    Comment:  occ   Drug use: Never     Allergies   Patient has no known allergies.   Review of Systems Review of Systems  Constitutional:  Positive for chills and fever.  HENT:  Negative for ear pain and sore throat.   Eyes:  Negative for pain and visual disturbance.  Respiratory:  Negative for cough and shortness of breath.   Cardiovascular:  Negative for chest pain and palpitations.  Gastrointestinal:  Positive for diarrhea and vomiting. Negative for abdominal pain.  Genitourinary:  Negative for dysuria and hematuria.  Musculoskeletal:  Negative for arthralgias and back pain.  Skin:  Negative for color change and rash.  Neurological:  Negative for seizures and syncope.  All other systems reviewed and are negative.    Physical Exam Triage Vital Signs ED Triage Vitals  Encounter Vitals Group     BP 09/29/22 1812 (!) 117/53     Systolic BP Percentile --      Diastolic BP Percentile --      Pulse Rate 09/29/22 1812 67     Resp 09/29/22 1812 14     Temp 09/29/22 1812 (!) 100.9 F (38.3 C)     Temp Source 09/29/22 1812 Oral     SpO2 09/29/22 1812 95 %     Weight --      Height --  Head Circumference --      Peak Flow --      Pain Score 09/29/22 1813 0     Pain Loc --      Pain Education --      Exclude from Growth Chart --    No data found.  Updated Vital Signs BP (!) 117/53 (BP Location: Right Arm)   Pulse 67   Temp (!) 100.9 F (38.3 C) (Oral)   Resp 14   SpO2 95%   Visual Acuity Right Eye Distance:   Left Eye Distance:   Bilateral Distance:    Right Eye Near:   Left Eye Near:    Bilateral Near:     Physical Exam Vitals and nursing note reviewed.  Constitutional:      General: He is not in acute distress.    Appearance: He is well-developed.  HENT:     Head: Normocephalic and atraumatic.  Eyes:     Conjunctiva/sclera: Conjunctivae normal.  Cardiovascular:     Rate and Rhythm: Normal rate and regular rhythm.     Heart sounds: No murmur  heard. Pulmonary:     Effort: Pulmonary effort is normal. No respiratory distress.     Breath sounds: Normal breath sounds.  Abdominal:     Palpations: Abdomen is soft.     Tenderness: There is no abdominal tenderness.  Musculoskeletal:        General: No swelling.     Cervical back: Neck supple.  Skin:    General: Skin is warm and dry.     Capillary Refill: Capillary refill takes less than 2 seconds.  Neurological:     Mental Status: He is alert.  Psychiatric:        Mood and Affect: Mood normal.      UC Treatments / Results  Labs (all labs ordered are listed, but only abnormal results are displayed) Labs Reviewed  SARS CORONAVIRUS 2 (TAT 6-24 HRS)  POCT INFLUENZA A/B    EKG   Radiology No results found.  Procedures Procedures (including critical care time)  Medications Ordered in UC Medications  acetaminophen (TYLENOL) tablet 650 mg (650 mg Oral Given 09/29/22 1816)  ondansetron (ZOFRAN-ODT) disintegrating tablet 4 mg (4 mg Oral Given 09/29/22 1907)    Initial Impression / Assessment and Plan / UC Course  I have reviewed the triage vital signs and the nursing notes.  Pertinent labs & imaging results that were available during my care of the patient were reviewed by me and considered in my medical decision making (see chart for details).     Zofran given in clinic with some relief of nausea.  Advised Imodium at home as needed for diarrhea.  Tylenol given with reduction of fever, on recheck temp 98.8.  Patient tolerating fluids in clinic today without difficulty.  Exam otherwise benign.  ED precautions given.  Supportive care discussed. Final Clinical Impressions(s) / UC Diagnoses   Final diagnoses:  Viral illness  Nausea vomiting and diarrhea     Discharge Instructions      Take Zofran as needed for nausea Recommend Imodium as needed for diarrhea, this is over the counter Drink plenty of fluids, recommend pedialyte or gatorade Take Tylenol as needed  for fever Return if you cannot keep fluids down.     ED Prescriptions   None    PDMP not reviewed this encounter.   Ward, Tylene Fantasia, PA-C 09/29/22 2106

## 2022-09-30 LAB — SARS CORONAVIRUS 2 (TAT 6-24 HRS): SARS Coronavirus 2: NEGATIVE

## 2022-10-03 ENCOUNTER — Emergency Department (HOSPITAL_COMMUNITY)
Admission: EM | Admit: 2022-10-03 | Discharge: 2022-10-03 | Disposition: A | Discharge status: Home / Self Care | Payer: Medicaid Other | Attending: Emergency Medicine | Admitting: Emergency Medicine

## 2022-10-03 ENCOUNTER — Encounter (HOSPITAL_COMMUNITY): Payer: Self-pay | Admitting: *Deleted

## 2022-10-03 ENCOUNTER — Other Ambulatory Visit: Payer: Self-pay

## 2022-10-03 DIAGNOSIS — D72819 Decreased white blood cell count, unspecified: Secondary | ICD-10-CM | POA: Diagnosis not present

## 2022-10-03 DIAGNOSIS — R109 Unspecified abdominal pain: Secondary | ICD-10-CM | POA: Diagnosis present

## 2022-10-03 DIAGNOSIS — R1084 Generalized abdominal pain: Secondary | ICD-10-CM

## 2022-10-03 DIAGNOSIS — R112 Nausea with vomiting, unspecified: Secondary | ICD-10-CM | POA: Insufficient documentation

## 2022-10-03 LAB — CBC WITH DIFFERENTIAL/PLATELET
Abs Immature Granulocytes: 0 10*3/uL (ref 0.00–0.07)
Basophils Absolute: 0 10*3/uL (ref 0.0–0.1)
Basophils Relative: 0 %
Eosinophils Absolute: 0 10*3/uL (ref 0.0–0.5)
Eosinophils Relative: 1 %
HCT: 45.3 % (ref 39.0–52.0)
Hemoglobin: 14.8 g/dL (ref 13.0–17.0)
Immature Granulocytes: 0 %
Lymphocytes Relative: 47 %
Lymphs Abs: 0.7 10*3/uL (ref 0.7–4.0)
MCH: 26.2 pg (ref 26.0–34.0)
MCHC: 32.7 g/dL (ref 30.0–36.0)
MCV: 80.3 fL (ref 80.0–100.0)
Monocytes Absolute: 0.1 10*3/uL (ref 0.1–1.0)
Monocytes Relative: 8 %
Neutro Abs: 0.7 10*3/uL — ABNORMAL LOW (ref 1.7–7.7)
Neutrophils Relative %: 44 %
Platelets: 142 10*3/uL — ABNORMAL LOW (ref 150–400)
RBC: 5.64 MIL/uL (ref 4.22–5.81)
RDW: 13 % (ref 11.5–15.5)
WBC: 1.6 10*3/uL — ABNORMAL LOW (ref 4.0–10.5)
nRBC: 0 % (ref 0.0–0.2)

## 2022-10-03 LAB — CBC
HCT: 44.3 % (ref 39.0–52.0)
Hemoglobin: 14.4 g/dL (ref 13.0–17.0)
MCH: 25.3 pg — ABNORMAL LOW (ref 26.0–34.0)
MCHC: 32.5 g/dL (ref 30.0–36.0)
MCV: 77.9 fL — ABNORMAL LOW (ref 80.0–100.0)
Platelets: 144 10*3/uL — ABNORMAL LOW (ref 150–400)
RBC: 5.69 MIL/uL (ref 4.22–5.81)
RDW: 12.9 % (ref 11.5–15.5)
WBC: 2.1 10*3/uL — ABNORMAL LOW (ref 4.0–10.5)
nRBC: 0 % (ref 0.0–0.2)

## 2022-10-03 LAB — COMPREHENSIVE METABOLIC PANEL
ALT: 38 U/L (ref 0–44)
AST: 41 U/L (ref 15–41)
Albumin: 3.8 g/dL (ref 3.5–5.0)
Alkaline Phosphatase: 39 U/L (ref 38–126)
Anion gap: 12 (ref 5–15)
BUN: 14 mg/dL (ref 6–20)
CO2: 25 mmol/L (ref 22–32)
Calcium: 8.5 mg/dL — ABNORMAL LOW (ref 8.9–10.3)
Chloride: 98 mmol/L (ref 98–111)
Creatinine, Ser: 0.95 mg/dL (ref 0.61–1.24)
GFR, Estimated: >60 mL/min (ref >60)
Glucose, Bld: 109 mg/dL — ABNORMAL HIGH (ref 70–99)
Potassium: 3.8 mmol/L (ref 3.5–5.1)
Sodium: 135 mmol/L (ref 135–145)
Total Bilirubin: 1.1 mg/dL (ref 0.3–1.2)
Total Protein: 6.9 g/dL (ref 6.5–8.1)

## 2022-10-03 LAB — LIPASE, BLOOD: Lipase: 44 U/L (ref 11–51)

## 2022-10-03 LAB — TROPONIN I (HIGH SENSITIVITY)
Troponin I (High Sensitivity): 6 ng/L (ref <18)
Troponin I (High Sensitivity): 7 ng/L (ref <18)

## 2022-10-03 MED ORDER — DROPERIDOL 2.5 MG/ML IJ SOLN
1.2500 mg | Freq: Once | INTRAMUSCULAR | Status: AC
  Administered 2022-10-03: 1.25 mg via INTRAVENOUS
  Filled 2022-10-03: qty 2

## 2022-10-03 MED ORDER — LACTATED RINGERS IV BOLUS
1000.0000 mL | Freq: Once | INTRAVENOUS | Status: AC
  Administered 2022-10-03: 1000 mL via INTRAVENOUS

## 2022-10-03 NOTE — ED Provider Notes (Signed)
  [place] EMERGENCY DEPARTMENT AT [facility] Provider Note   CSN: [id number] Arrival date & time: 10/03/22  1510     History {Add pertinent medical, surgical, social history, OB history to HPI:1} Chief Complaint  Patient presents with   Abdominal Pain    Calvin Martinez is a 22 y.o. male.   Abdominal Pain      Home Medications Prior to Admission medications   Medication Sig Start Date End Date Taking? Authorizing Provider  cetirizine (ZYRTEC) 10 MG tablet Take 1 tablet (10 mg total) by mouth daily as needed for allergies or rhinitis. 04/17/22   Hermanns, Ashlee P, PA-C  fluticasone (FLONASE) 50 MCG/ACT nasal spray Place 1 spray into both nostrils daily as needed for allergies or rhinitis. 04/17/22   Hermanns, Ashlee P, PA-C  ondansetron (ZOFRAN-ODT) 4 MG disintegrating tablet Take 1 tablet (4 mg total) by mouth every 8 (eight) hours as needed for nausea or vomiting. 09/17/22   Raspet, Denny Peon K, PA-C  sucralfate (CARAFATE) 1 g tablet Take 1 tablet (1 g total) by mouth 4 (four) times daily -  with meals and at bedtime. 09/17/22   Raspet, Noberto Retort, PA-C      Allergies    Patient has no known allergies.    Review of Systems   Review of Systems  Gastrointestinal:  Positive for abdominal pain.    Physical Exam Updated Vital Signs BP 110/76 (BP Location: Right Arm)   Pulse 80   Temp 98.1 F (36.7 C) (Oral)   Resp 16   Ht 6\' 2"  (1.88 m)   Wt 77.1 kg   SpO2 100%   BMI 21.82 kg/m  Physical Exam  ED Results / Procedures / Treatments   Labs (all labs ordered are listed, but only abnormal results are displayed) Labs Reviewed  COMPREHENSIVE METABOLIC PANEL - Abnormal; Notable for the following components:      Result Value   Glucose, Bld 109 (*)    Calcium 8.5 (*)    All other components within normal limits  CBC - Abnormal; Notable for the following components:   WBC 2.1 (*)    MCV 77.9 (*)    MCH 25.3 (*)    Platelets 144 (*)    All other components  within normal limits  LIPASE, BLOOD  URINALYSIS, ROUTINE W REFLEX MICROSCOPIC  TROPONIN I (HIGH SENSITIVITY)  TROPONIN I (HIGH SENSITIVITY)    EKG None  Radiology No results found.  Procedures Procedures  {Document cardiac monitor, telemetry assessment procedure when appropriate:1}  Medications Ordered in ED Medications - No data to display  ED Course/ Medical Decision Making/ A&P   {   Click here for ABCD2, HEART and other calculatorsREFRESH Note before signing :1}                              Medical Decision Making  ***  {Document critical care time when appropriate:1} {Document review of labs and clinical decision tools ie heart score, Chads2Vasc2 etc:1}  {Document your independent review of radiology images, and any outside records:1} {Document your discussion with family members, caretakers, and with consultants:1} {Document social determinants of health affecting pt's care:1} {Document your decision making why or why not admission, treatments were needed:1} Final Clinical Impression(s) / ED Diagnoses Final diagnoses:  None    Rx / DC Orders ED Discharge Orders     None

## 2022-10-03 NOTE — Discharge Instructions (Signed)
You were seen today for abdominal pain as well as nausea and vomiting.  Your labs did show that your white blood cells are low.  I do recommend that you follow-up with your primary care doctor to have your labs rechecked within the next few weeks.  Please return to the emergency department for new or worsening abdominal pain or inability to keep down fluids.

## 2022-10-03 NOTE — ED Triage Notes (Signed)
Abd chest pain n v and diarrhea for 3 weeks when he drank too much alcohol and was very intoxicated   he has been going to urgent cares with no improvement

## 2022-10-29 ENCOUNTER — Ambulatory Visit: Payer: Medicaid Other | Admitting: Nurse Practitioner

## 2022-11-13 ENCOUNTER — Other Ambulatory Visit: Payer: Self-pay

## 2022-11-13 ENCOUNTER — Emergency Department (HOSPITAL_COMMUNITY)
Admission: EM | Admit: 2022-11-13 | Discharge: 2022-11-13 | Disposition: A | Discharge status: Home / Self Care | Payer: Medicaid Other | Attending: Emergency Medicine | Admitting: Emergency Medicine

## 2022-11-13 ENCOUNTER — Encounter (HOSPITAL_COMMUNITY): Payer: Self-pay

## 2022-11-13 DIAGNOSIS — K529 Noninfective gastroenteritis and colitis, unspecified: Secondary | ICD-10-CM | POA: Insufficient documentation

## 2022-11-13 DIAGNOSIS — R112 Nausea with vomiting, unspecified: Secondary | ICD-10-CM | POA: Diagnosis present

## 2022-11-13 LAB — CBC
HCT: 41.4 % (ref 39.0–52.0)
Hemoglobin: 13.1 g/dL (ref 13.0–17.0)
MCH: 25.5 pg — ABNORMAL LOW (ref 26.0–34.0)
MCHC: 31.6 g/dL (ref 30.0–36.0)
MCV: 80.7 fL (ref 80.0–100.0)
Platelets: 212 10*3/uL (ref 150–400)
RBC: 5.13 MIL/uL (ref 4.22–5.81)
RDW: 14.2 % (ref 11.5–15.5)
WBC: 7 10*3/uL (ref 4.0–10.5)
nRBC: 0 % (ref 0.0–0.2)

## 2022-11-13 LAB — URINALYSIS, ROUTINE W REFLEX MICROSCOPIC
Bacteria, UA: NONE SEEN
Bilirubin Urine: NEGATIVE
Glucose, UA: NEGATIVE mg/dL
Hgb urine dipstick: NEGATIVE
Ketones, ur: 80 mg/dL — AB
Leukocytes,Ua: NEGATIVE
Nitrite: NEGATIVE
Protein, ur: 30 mg/dL — AB
Specific Gravity, Urine: 1.03 (ref 1.005–1.030)
pH: 6 (ref 5.0–8.0)

## 2022-11-13 LAB — COMPREHENSIVE METABOLIC PANEL
ALT: 58 U/L — ABNORMAL HIGH (ref 0–44)
AST: 42 U/L — ABNORMAL HIGH (ref 15–41)
Albumin: 4.5 g/dL (ref 3.5–5.0)
Alkaline Phosphatase: 72 U/L (ref 38–126)
Anion gap: 9 (ref 5–15)
BUN: 13 mg/dL (ref 6–20)
CO2: 27 mmol/L (ref 22–32)
Calcium: 8.8 mg/dL — ABNORMAL LOW (ref 8.9–10.3)
Chloride: 100 mmol/L (ref 98–111)
Creatinine, Ser: 0.68 mg/dL (ref 0.61–1.24)
GFR, Estimated: >60 mL/min (ref >60)
Glucose, Bld: 105 mg/dL — ABNORMAL HIGH (ref 70–99)
Potassium: 3.3 mmol/L — ABNORMAL LOW (ref 3.5–5.1)
Sodium: 136 mmol/L (ref 135–145)
Total Bilirubin: 1.3 mg/dL — ABNORMAL HIGH (ref 0.3–1.2)
Total Protein: 8.4 g/dL — ABNORMAL HIGH (ref 6.5–8.1)

## 2022-11-13 LAB — LIPASE, BLOOD: Lipase: 26 U/L (ref 11–51)

## 2022-11-13 MED ORDER — ONDANSETRON 8 MG PO TBDP: 8.0000 mg | ORAL_TABLET | Freq: Three times a day (TID) | ORAL | 0 refills | Status: DC | PRN

## 2022-11-13 NOTE — ED Notes (Signed)
Pt provided with po fluids,- gingerale - per Dr. Freida Busman

## 2022-11-13 NOTE — ED Triage Notes (Signed)
Pt biba reporting abdominal pain and vomiting since this morning after eating fast food. No other symptoms reported

## 2022-11-13 NOTE — ED Provider Notes (Signed)
[place] EMERGENCY DEPARTMENT AT [facility] Provider Note   CSN: [id number] Arrival date & time: 11/13/22  1821     History  Chief Complaint  Patient presents with   Abdominal Pain    TADARRIUS BURCH is a 22 y.o. male.  22 year old male presents with nausea and vomiting diarrhea which began today after eating a biscuit.  States he had this around 10:00 this morning.  Has some abdominal cramping with this which is since resolved.  He denies any abdominal discomfort currently.  Has subjective fever and chills which is also resolved.  Denies any URI symptoms.  Denies any urinary symptoms.  No treatment use prior to arrival       Home Medications Prior to Admission medications   Medication Sig Start Date End Date Taking? Authorizing Provider  cetirizine (ZYRTEC) 10 MG tablet Take 1 tablet (10 mg total) by mouth daily as needed for allergies or rhinitis. 04/17/22   Hermanns, Ashlee P, PA-C  fluticasone (FLONASE) 50 MCG/ACT nasal spray Place 1 spray into both nostrils daily as needed for allergies or rhinitis. 04/17/22   Hermanns, Ashlee P, PA-C  ondansetron (ZOFRAN-ODT) 4 MG disintegrating tablet Take 1 tablet (4 mg total) by mouth every 8 (eight) hours as needed for nausea or vomiting. 09/17/22   Raspet, Denny Peon K, PA-C  sucralfate (CARAFATE) 1 g tablet Take 1 tablet (1 g total) by mouth 4 (four) times daily -  with meals and at bedtime. 09/17/22   Raspet, Noberto Retort, PA-C      Allergies    Patient has no known allergies.    Review of Systems   Review of Systems  All other systems reviewed and are negative.   Physical Exam Updated Vital Signs BP 106/68 (BP Location: Right Arm)   Pulse 74   Temp 98.4 F (36.9 C) (Oral)   Resp 18   Ht 1.88 m (6\' 2" )   Wt 77.1 kg   SpO2 100%   BMI 21.82 kg/m  Physical Exam Vitals and nursing note reviewed.  Constitutional:      General: He is not in acute distress.    Appearance: Normal appearance. He is well-developed. He is  not toxic-appearing.  HENT:     Head: Normocephalic and atraumatic.  Eyes:     General: Lids are normal.     Conjunctiva/sclera: Conjunctivae normal.     Pupils: Pupils are equal, round, and reactive to light.  Neck:     Thyroid: No thyroid mass.     Trachea: No tracheal deviation.  Cardiovascular:     Rate and Rhythm: Normal rate and regular rhythm.     Heart sounds: Normal heart sounds. No murmur heard.    No gallop.  Pulmonary:     Effort: Pulmonary effort is normal. No respiratory distress.     Breath sounds: Normal breath sounds. No stridor. No decreased breath sounds, wheezing, rhonchi or rales.  Abdominal:     General: There is no distension.     Palpations: Abdomen is soft.     Tenderness: There is no abdominal tenderness. There is no rebound.  Musculoskeletal:        General: No tenderness. Normal range of motion.     Cervical back: Normal range of motion and neck supple.  Skin:    General: Skin is warm and dry.     Findings: No abrasion or rash.  Neurological:     Mental Status: He is alert and oriented to person, place, and  time. Mental status is at baseline.     GCS: GCS eye subscore is 4. GCS verbal subscore is 5. GCS motor subscore is 6.     Cranial Nerves: Cranial nerves are intact. No cranial nerve deficit.     Sensory: No sensory deficit.     Motor: Motor function is intact.  Psychiatric:        Attention and Perception: Attention normal.        Speech: Speech normal.        Behavior: Behavior normal.     ED Results / Procedures / Treatments   Labs (all labs ordered are listed, but only abnormal results are displayed) Labs Reviewed  COMPREHENSIVE METABOLIC PANEL - Abnormal; Notable for the following components:      Result Value   Potassium 3.3 (*)    Glucose, Bld 105 (*)    Calcium 8.8 (*)    Total Protein 8.4 (*)    AST 42 (*)    ALT 58 (*)    Total Bilirubin 1.3 (*)    All other components within normal limits  CBC - Abnormal; Notable for the  following components:   MCH 25.5 (*)    All other components within normal limits  LIPASE, BLOOD  URINALYSIS, ROUTINE W REFLEX MICROSCOPIC    EKG None  Radiology No results found.  Procedures Procedures    Medications Ordered in ED Medications - No data to display  ED Course/ Medical Decision Making/ A&P                                 Medical Decision Making Amount and/or Complexity of Data Reviewed Labs: ordered.   Patient's labs are reassuring here exception of dehydration.  Patient able to drink a whole cupful of ginger ale without any emesis.  Patient offered IV hydration but feels that he can keep down orals okay.  Suspect gastroenteritis.  He has no abdominal pain at this time.  No concern for appendicitis.  Will discharge home        Final Clinical Impression(s) / ED Diagnoses Final diagnoses:  None    Rx / DC Orders ED Discharge Orders     None         Lorre Nick, MD 11/13/22 2205

## 2022-11-13 NOTE — ED Notes (Signed)
10:23 PM  Discharge instructions discussed with patient and patient voices understanding of discharge instructions. Patient is stable at discharge. He declines any questions or concerns regarding discharge. Prescription information discussed with patient using teach-back method. Patient ambulatory with steady and equal gait. Patient called family for ride.

## 2022-12-01 DIAGNOSIS — R112 Nausea with vomiting, unspecified: Secondary | ICD-10-CM | POA: Diagnosis present

## 2022-12-01 DIAGNOSIS — R197 Diarrhea, unspecified: Secondary | ICD-10-CM | POA: Insufficient documentation

## 2022-12-02 ENCOUNTER — Other Ambulatory Visit: Payer: Self-pay

## 2022-12-02 ENCOUNTER — Emergency Department (HOSPITAL_COMMUNITY)
Admission: EM | Admit: 2022-12-02 | Discharge: 2022-12-02 | Disposition: A | Discharge status: Home / Self Care | Payer: Medicaid Other | Attending: Emergency Medicine | Admitting: Emergency Medicine

## 2022-12-02 ENCOUNTER — Encounter (HOSPITAL_COMMUNITY): Payer: Self-pay

## 2022-12-02 ENCOUNTER — Emergency Department (HOSPITAL_COMMUNITY): Payer: Medicaid Other

## 2022-12-02 DIAGNOSIS — R112 Nausea with vomiting, unspecified: Secondary | ICD-10-CM

## 2022-12-02 LAB — COMPREHENSIVE METABOLIC PANEL
ALT: 49 U/L — ABNORMAL HIGH (ref 0–44)
AST: 44 U/L — ABNORMAL HIGH (ref 15–41)
Albumin: 4.4 g/dL (ref 3.5–5.0)
Alkaline Phosphatase: 55 U/L (ref 38–126)
Anion gap: 14 (ref 5–15)
BUN: 21 mg/dL — ABNORMAL HIGH (ref 6–20)
CO2: 22 mmol/L (ref 22–32)
Calcium: 9.5 mg/dL (ref 8.9–10.3)
Chloride: 102 mmol/L (ref 98–111)
Creatinine, Ser: 0.83 mg/dL (ref 0.61–1.24)
GFR, Estimated: >60 mL/min (ref >60)
Glucose, Bld: 126 mg/dL — ABNORMAL HIGH (ref 70–99)
Potassium: 3.2 mmol/L — ABNORMAL LOW (ref 3.5–5.1)
Sodium: 138 mmol/L (ref 135–145)
Total Bilirubin: 1.7 mg/dL — ABNORMAL HIGH (ref 0.3–1.2)
Total Protein: 8.1 g/dL (ref 6.5–8.1)

## 2022-12-02 LAB — URINALYSIS, ROUTINE W REFLEX MICROSCOPIC
Bilirubin Urine: NEGATIVE
Glucose, UA: NEGATIVE mg/dL
Hgb urine dipstick: NEGATIVE
Ketones, ur: 80 mg/dL — AB
Nitrite: NEGATIVE
Protein, ur: 100 mg/dL — AB
Specific Gravity, Urine: 1.029 (ref 1.005–1.030)
pH: 8 (ref 5.0–8.0)

## 2022-12-02 LAB — CBC
HCT: 38.3 % — ABNORMAL LOW (ref 39.0–52.0)
Hemoglobin: 12.6 g/dL — ABNORMAL LOW (ref 13.0–17.0)
MCH: 25.3 pg — ABNORMAL LOW (ref 26.0–34.0)
MCHC: 32.9 g/dL (ref 30.0–36.0)
MCV: 76.9 fL — ABNORMAL LOW (ref 80.0–100.0)
Platelets: 210 10*3/uL (ref 150–400)
RBC: 4.98 MIL/uL (ref 4.22–5.81)
RDW: 14 % (ref 11.5–15.5)
WBC: 10.6 10*3/uL — ABNORMAL HIGH (ref 4.0–10.5)
nRBC: 0 % (ref 0.0–0.2)

## 2022-12-02 LAB — LIPASE, BLOOD: Lipase: 22 U/L (ref 11–51)

## 2022-12-02 MED ORDER — METOCLOPRAMIDE HCL 10 MG PO TABS: 10.0000 mg | ORAL_TABLET | Freq: Four times a day (QID) | ORAL | 0 refills | Status: DC

## 2022-12-02 MED ORDER — ONDANSETRON 4 MG PO TBDP
4.0000 mg | ORAL_TABLET | Freq: Once | ORAL | Status: AC | PRN
  Administered 2022-12-02: 4 mg via ORAL
  Filled 2022-12-02: qty 1

## 2022-12-02 MED ORDER — DIPHENHYDRAMINE HCL 50 MG/ML IJ SOLN
25.0000 mg | Freq: Once | INTRAMUSCULAR | Status: AC
  Administered 2022-12-02: 25 mg via INTRAVENOUS
  Filled 2022-12-02: qty 1

## 2022-12-02 MED ORDER — METOCLOPRAMIDE HCL 5 MG/ML IJ SOLN
10.0000 mg | Freq: Once | INTRAMUSCULAR | Status: AC
  Administered 2022-12-02: 10 mg via INTRAVENOUS
  Filled 2022-12-02: qty 2

## 2022-12-02 NOTE — ED Triage Notes (Signed)
Patient arrived POV from home with complaint of generalized abdominal pain with nausea and vomiting and diarrhea. Pt reports symptoms starting today.

## 2022-12-02 NOTE — ED Provider Notes (Signed)
[facility] Emergency Department Provider Note MRN:  [id number]  Arrival date & time: 12/02/22     Chief Complaint   Abdominal Pain   History of Present Illness   Calvin Martinez is a 22 y.o. year-old male presents to the ED with chief complaint of nausea, vomiting, and diarrhea.  Onset was yesterday afternoon.  States that he tried zofran, but that made him feel worse.  He denies any sick contacts.  States that this is his 2nd episode in the past month of having these symptoms.  States that he is starting to feel a little better now.  History provided by patient.   Review of Systems  Pertinent positive and negative review of systems noted in HPI.    Physical Exam   Vitals:   12/02/22 0029 12/02/22 0535  BP: (!) 130/56 122/70  Pulse: 73 63  Resp: 17 16  Temp: 97.7 F (36.5 C) 98 F (36.7 C)  SpO2: 100% 100%    CONSTITUTIONAL:  non toxic-appearing, NAD NEURO:  Alert and oriented x 3, CN 3-12 grossly intact EYES:  eyes equal and reactive ENT/NECK:  Supple, no stridor  CARDIO:  normal rate, regular rhythm, appears well-perfused  PULM:  No respiratory distress, CTAB GI/GU:  non-distended, no focal tenderness MSK/SPINE:  No gross deformities, no edema, moves all extremities  SKIN:  no rash, atraumatic   *Additional and/or pertinent findings included in MDM below  Diagnostic and Interventional Summary    EKG Interpretation Date/Time:    Ventricular Rate:    PR Interval:    QRS Duration:    QT Interval:    QTC Calculation:   R Axis:      Text Interpretation:         Labs Reviewed  COMPREHENSIVE METABOLIC PANEL - Abnormal; Notable for the following components:      Result Value   Potassium 3.2 (*)    Glucose, Bld 126 (*)    BUN 21 (*)    AST 44 (*)    ALT 49 (*)    Total Bilirubin 1.7 (*)    All other components within normal limits  CBC - Abnormal; Notable for the following components:   WBC 10.6 (*)    Hemoglobin 12.6  (*)    HCT 38.3 (*)    MCV 76.9 (*)    MCH 25.3 (*)    All other components within normal limits  URINALYSIS, ROUTINE W REFLEX MICROSCOPIC - Abnormal; Notable for the following components:   APPearance HAZY (*)    Ketones, ur 80 (*)    Protein, ur 100 (*)    Leukocytes,Ua TRACE (*)    Bacteria, UA RARE (*)    All other components within normal limits  LIPASE, BLOOD    No orders to display    Medications  metoCLOPramide (REGLAN) injection 10 mg (has no administration in time range)  diphenhydrAMINE (BENADRYL) injection 25 mg (has no administration in time range)  ondansetron (ZOFRAN-ODT) disintegrating tablet 4 mg (4 mg Oral Given 12/02/22 0032)     Procedures  /  Critical Care Procedures  ED Course and Medical Decision Making  I have reviewed the triage vital signs, the nursing notes, and pertinent available records from the EMR.  Social Determinants Affecting Complexity of Care: Patient has no clinically significant social determinants affecting this chief complaint..   ED Course: Clinical Course as of 12/02/22 0635  Wed Dec 02, 2022  4098 N/V/D started yesterday. Feels better now. Tbili elevated.  [place] pending. Likely dc if US unremarkable. [JR]    Clinical Course User Index [JR] Gareth Eagle, PA-C    Medical Decision Making Amount and/or Complexity of Data Reviewed Labs: ordered.  Risk Prescription drug management.         Consultants: No consultations were needed in caring for this patient.   Treatment and Plan: Patient signed out to oncoming team.  Plan to f/u on [place] and fluid challenge after meds.    Final Clinical Impressions(s) / ED Diagnoses     ICD-10-CM   1. Nausea vomiting and diarrhea  R11.2    R19.7       ED Discharge Orders          Ordered    metoCLOPramide (REGLAN) 10 MG tablet  Every 6 hours        12/02/22 0608              Discharge Instructions Discussed with and Provided to Patient:   Discharge Instructions    None      Roxy Horseman, PA-C 12/02/22 0636    Nira Conn, MD 12/02/22 [phone]

## 2022-12-02 NOTE — ED Provider Notes (Signed)
Accepted handoff at shift change from [facility]. Please see prior provider note for more detail.   Briefly: Patient is 22 y.o. presenting for nausea vomiting diarrhea that started yesterday.  DDX: concern for intra-abdominal infection, acute cholecystitis or gallstones, electrolyte derangement or viral gastroenteritis  Plan: Follow-up on right upper quadrant ultrasound.  If reassuring and unremarkable can be discharged with PCP follow-up.  Physical Exam  BP 122/70 (BP Location: Right Arm)   Pulse 63   Temp 98 F (36.7 C)   Resp 16   Ht 6\' 2"  (1.88 m)   Wt 79.4 kg   SpO2 100%   BMI 22.47 kg/m   Physical Exam  Procedures  Procedures  ED Course / MDM   Clinical Course as of 12/02/22 0824  Wed Dec 02, 2022  0631 N/V/D started yesterday. Feels better now. Tbili elevated. [place] pending. Likely dc if US unremarkable. [JR]  4098 US Abdomen Limited RUQ (LIVER/GB) [JR]    Clinical Course User Index [JR] Gareth Eagle, PA-C   Medical Decision Making Amount and/or Complexity of Data Reviewed Labs: ordered. Radiology: ordered. Decision-making details documented in ED Course.  Risk Prescription drug management.   On reassessment, patient stated that he was feeling better and still denied abdominal pain.  Ultrasound was reassuring.  Discussed return precautions.  Advised to follow-up with his PCP.  Vital stable discharge home with condition.       Gareth Eagle, PA-C 12/02/22 0826    Franne Forts, DO 12/03/22 [phone]

## 2022-12-02 NOTE — Discharge Instructions (Addendum)
Evaluation today was overall reassuring.  Recommend you follow-up your PCP if your symptoms persist.  If you develop abdominal pain, fever or inability to tolerate fluid intake or any other concerning symptom please return emerged part further evaluation.

## 2022-12-02 NOTE — ED Notes (Signed)
480 ml gatorade given for pt to attempt fluid challenge.

## 2022-12-16 ENCOUNTER — Emergency Department (HOSPITAL_COMMUNITY): Payer: Medicaid Other

## 2022-12-16 ENCOUNTER — Ambulatory Visit: Payer: Medicaid Other | Admitting: Family Medicine

## 2022-12-16 ENCOUNTER — Emergency Department (HOSPITAL_COMMUNITY)
Admission: EM | Admit: 2022-12-16 | Discharge: 2022-12-16 | Disposition: A | Discharge status: Home / Self Care | Payer: Medicaid Other | Attending: Emergency Medicine | Admitting: Emergency Medicine

## 2022-12-16 ENCOUNTER — Encounter: Payer: Self-pay | Admitting: Family Medicine

## 2022-12-16 VITALS — BP 110/60 | HR 65 | Temp 98.1°F | Ht 74.0 in | Wt 157.0 lb

## 2022-12-16 DIAGNOSIS — Z1159 Encounter for screening for other viral diseases: Secondary | ICD-10-CM

## 2022-12-16 DIAGNOSIS — Z83430 Family history of elevated lipoprotein(a): Secondary | ICD-10-CM

## 2022-12-16 DIAGNOSIS — Z833 Family history of diabetes mellitus: Secondary | ICD-10-CM

## 2022-12-16 DIAGNOSIS — Z2821 Immunization not carried out because of patient refusal: Secondary | ICD-10-CM

## 2022-12-16 DIAGNOSIS — R112 Nausea with vomiting, unspecified: Secondary | ICD-10-CM | POA: Diagnosis present

## 2022-12-16 DIAGNOSIS — Z7689 Persons encountering health services in other specified circumstances: Secondary | ICD-10-CM

## 2022-12-16 DIAGNOSIS — R1084 Generalized abdominal pain: Secondary | ICD-10-CM | POA: Insufficient documentation

## 2022-12-16 DIAGNOSIS — R109 Unspecified abdominal pain: Secondary | ICD-10-CM

## 2022-12-16 DIAGNOSIS — Z0000 Encounter for general adult medical examination without abnormal findings: Secondary | ICD-10-CM

## 2022-12-16 DIAGNOSIS — Z114 Encounter for screening for human immunodeficiency virus [HIV]: Secondary | ICD-10-CM | POA: Diagnosis not present

## 2022-12-16 LAB — COMPREHENSIVE METABOLIC PANEL
ALT: 30 U/L (ref 0–44)
AST: 33 U/L (ref 15–41)
Albumin: 4.4 g/dL (ref 3.5–5.0)
Alkaline Phosphatase: 61 U/L (ref 38–126)
Anion gap: 9 (ref 5–15)
BUN: 9 mg/dL (ref 6–20)
CO2: 24 mmol/L (ref 22–32)
Calcium: 9.4 mg/dL (ref 8.9–10.3)
Chloride: 104 mmol/L (ref 98–111)
Creatinine, Ser: 0.98 mg/dL (ref 0.61–1.24)
GFR, Estimated: >60 mL/min (ref >60)
Glucose, Bld: 116 mg/dL — ABNORMAL HIGH (ref 70–99)
Potassium: 3.6 mmol/L (ref 3.5–5.1)
Sodium: 137 mmol/L (ref 135–145)
Total Bilirubin: 1.3 mg/dL — ABNORMAL HIGH (ref <1.2)
Total Protein: 8.1 g/dL (ref 6.5–8.1)

## 2022-12-16 LAB — CBC WITH DIFFERENTIAL/PLATELET
Abs Immature Granulocytes: 0.04 10*3/uL (ref 0.00–0.07)
Basophils Absolute: 0 10*3/uL (ref 0.0–0.1)
Basophils Relative: 0 %
Eosinophils Absolute: 0 10*3/uL (ref 0.0–0.5)
Eosinophils Relative: 0 %
HCT: 43.3 % (ref 39.0–52.0)
Hemoglobin: 13.6 g/dL (ref 13.0–17.0)
Immature Granulocytes: 0 %
Lymphocytes Relative: 11 %
Lymphs Abs: 1.3 10*3/uL (ref 0.7–4.0)
MCH: 24.9 pg — ABNORMAL LOW (ref 26.0–34.0)
MCHC: 31.4 g/dL (ref 30.0–36.0)
MCV: 79.3 fL — ABNORMAL LOW (ref 80.0–100.0)
Monocytes Absolute: 0.9 10*3/uL (ref 0.1–1.0)
Monocytes Relative: 7 %
Neutro Abs: 10 10*3/uL — ABNORMAL HIGH (ref 1.7–7.7)
Neutrophils Relative %: 82 %
Platelets: 227 10*3/uL (ref 150–400)
RBC: 5.46 MIL/uL (ref 4.22–5.81)
RDW: 14.1 % (ref 11.5–15.5)
WBC: 12.2 10*3/uL — ABNORMAL HIGH (ref 4.0–10.5)
nRBC: 0 % (ref 0.0–0.2)

## 2022-12-16 LAB — LIPASE, BLOOD: Lipase: 27 U/L (ref 11–51)

## 2022-12-16 MED ORDER — MORPHINE SULFATE (PF) 4 MG/ML IV SOLN
4.0000 mg | Freq: Once | INTRAVENOUS | Status: AC
  Administered 2022-12-16: 4 mg via INTRAVENOUS
  Filled 2022-12-16: qty 1

## 2022-12-16 MED ORDER — ALUM & MAG HYDROXIDE-SIMETH 200-200-20 MG/5ML PO SUSP
30.0000 mL | Freq: Once | ORAL | Status: AC
  Administered 2022-12-16: 30 mL via ORAL
  Filled 2022-12-16: qty 30

## 2022-12-16 MED ORDER — ONDANSETRON HCL 4 MG/2ML IJ SOLN
4.0000 mg | Freq: Once | INTRAMUSCULAR | Status: AC
  Administered 2022-12-16: 4 mg via INTRAVENOUS
  Filled 2022-12-16: qty 2

## 2022-12-16 MED ORDER — DICYCLOMINE HCL 10 MG PO CAPS
10.0000 mg | ORAL_CAPSULE | Freq: Once | ORAL | Status: AC
  Administered 2022-12-16: 10 mg via ORAL
  Filled 2022-12-16: qty 1

## 2022-12-16 MED ORDER — PANTOPRAZOLE SODIUM 40 MG PO TBEC: 40.0000 mg | DELAYED_RELEASE_TABLET | Freq: Every day | ORAL | 0 refills | Status: DC

## 2022-12-16 MED ORDER — LIDOCAINE VISCOUS HCL 2 % MT SOLN
15.0000 mL | Freq: Once | OROMUCOSAL | Status: AC
  Administered 2022-12-16: 15 mL via ORAL
  Filled 2022-12-16: qty 15

## 2022-12-16 MED ORDER — OXYCODONE-ACETAMINOPHEN 5-325 MG PO TABS: 1.0000 | ORAL_TABLET | Freq: Once | ORAL | Status: DC

## 2022-12-16 NOTE — ED Provider Triage Note (Signed)
Emergency Medicine Provider Triage Evaluation Note  Tanish Duru [place] , a 22 y.o. male  was evaluated in triage.  Pt complains of bilious vomiting that began earlier today.  Patient with a primary care provider earlier this morning and was fine however at some point between then and now began having bilious vomiting.  Patient was dry heaving on exam and unable to provide history.  Upon chart review it appears patient has been seen in the past recently for nausea vomiting and has a distended gallbladder and does have bilious vomit on him and suspect that a biliary pathology.  Patient unable to give ROS  Review of Systems  Positive: See HPI Negative: See HPI  Physical Exam  BP 130/72 (BP Location: Right Arm)   Pulse 67   Temp 98.1 F (36.7 C)   Resp 18   SpO2 100%  Gen:   Awake, no distress Resp:  Normal effort  MSK:   Moves extremities without difficulty  Other:  Right upper quadrant tenderness and guarding, no skin color changes  Medical Decision Making  Medically screening exam initiated at 2:32 PM.  Appropriate orders placed.  Robley Dutton Mahn was informed that the remainder of the evaluation will be completed by another provider, this initial triage assessment does not replace that evaluation, and the importance of remaining in the ED until their evaluation is complete.  Right initiated, labs and imaging obtained along with medications for patient's symptoms, patient stable at this time.   Netta Corrigan, PA-C 12/16/22 1434

## 2022-12-16 NOTE — Progress Notes (Unsigned)
I,Jameka J Llittleton, CMA,acting as a Neurosurgeon for [employer], NP.,have documented all relevant documentation on the behalf of Ellender Hose, NP,as directed by  Ellender Hose, NP while in the presence of Ellender Hose, NP.  Subjective:   Patient ID: Calvin Martinez , male    DOB: 14-Mar-2000 , 22 y.o.   MRN: [id number]  Chief Complaint  Patient presents with   Establish Care    HPI  Patient presents today to establish care.  Patient also reported he has been to the ER a few times with stomach issues he stated that  every time he eats he gets nauseous. Last visit was 12/02/2022. Patient reports he was started on Reglan and so far the medication has been helping him.Today he denies any Nausea, Vomiting or Diarrhea. Patient was asked to schedule an appointment with [facility] but he has not done so, will refer him.      No past medical history on file.   Family History  Problem Relation Age of Onset   Healthy Mother    Healthy Father      Current Outpatient Medications:    cetirizine (ZYRTEC) 10 MG tablet, Take 1 tablet (10 mg total) by mouth daily as needed for allergies or rhinitis., Disp: 30 tablet, Rfl: 0   fluticasone (FLONASE) 50 MCG/ACT nasal spray, Place 1 spray into both nostrils daily as needed for allergies or rhinitis., Disp: 15.8 mL, Rfl: 0   metoCLOPramide (REGLAN) 10 MG tablet, Take 1 tablet (10 mg total) by mouth every 6 (six) hours., Disp: 30 tablet, Rfl: 0   ondansetron (ZOFRAN-ODT) 4 MG disintegrating tablet, Take 1 tablet (4 mg total) by mouth every 8 (eight) hours as needed for nausea or vomiting., Disp: 20 tablet, Rfl: 0   ondansetron (ZOFRAN-ODT) 8 MG disintegrating tablet, Take 1 tablet (8 mg total) by mouth every 8 (eight) hours as needed for nausea or vomiting., Disp: 20 tablet, Rfl: 0   sucralfate (CARAFATE) 1 g tablet, Take 1 tablet (1 g total) by mouth 4 (four) times daily -  with meals and at bedtime., Disp: 28 tablet, Rfl: 0   No Known Allergies   Men's  preventive visit. Patient Health Questionnaire [facility]) is . Patient is on a *** diet. Marital status: Single. Relevant history for alcohol use is:  Social History   Substance and Sexual Activity  Alcohol Use Yes   Comment: occ  . Relevant history for tobacco use is:  Social History   Tobacco Use  Smoking Status Never  Smokeless Tobacco Never  .   Review of Systems  Constitutional: Negative.   HENT: Negative.    Eyes: Negative.   Respiratory: Negative.    Cardiovascular: Negative.   Gastrointestinal: Negative.  Negative for abdominal distention, abdominal pain, diarrhea, nausea and vomiting.  Endocrine: Negative.   Genitourinary: Negative.   Musculoskeletal: Negative.   Skin: Negative.   Neurological: Negative.   Hematological: Negative.   Psychiatric/Behavioral: Negative.       Today's Vitals   12/16/22 1044  Temp: 98.1 F (36.7 C)  Weight: 157 lb (71.2 kg)  Height: 6\' 2"  (1.88 m)   Body mass index is 20.16 kg/m.  Wt Readings from Last 3 Encounters:  12/16/22 157 lb (71.2 kg)  12/02/22 175 lb (79.4 kg)  11/13/22 169 lb 15.6 oz (77.1 kg)    Objective:  Physical Exam Constitutional:      Appearance: Normal appearance.  Cardiovascular:     Rate and Rhythm: Normal rate and regular rhythm.  Pulses: Normal pulses.     Heart sounds: Normal heart sounds.  Pulmonary:     Effort: Pulmonary effort is normal.     Breath sounds: Normal breath sounds.  Abdominal:     General: Bowel sounds are normal.  Musculoskeletal:        General: Normal range of motion.  Skin:    General: Skin is warm and dry.  Neurological:     General: No focal deficit present.     Mental Status: He is alert and oriented to person, place, and time. Mental status is at baseline.  Psychiatric:        Mood and Affect: Mood normal.         Assessment And Plan:    Establishing care with new doctor, encounter for     No follow-ups on file. Patient was given opportunity to ask  questions. Patient verbalized understanding of the plan and was able to repeat key elements of the plan. All questions were answered to their satisfaction.   Ellender Hose, NP  I, Ellender Hose, NP, have reviewed all documentation for this visit. The documentation on 12/16/22 for the exam, diagnosis, procedures, and orders are all accurate and complete.

## 2022-12-16 NOTE — ED Provider Notes (Signed)
[facility] AT [facility] Provider Note   CSN: [id number] Arrival date & time: 12/16/22  1323     History  Chief Complaint  Patient presents with   Emesis    Calvin Martinez is a 22 y.o. male with overall noncontributory past medical history presents with concern for nausea, vomiting for 1 hour with generalized abdominal pain.  Patient was attempting to follow-up with GI doctor earlier today, reports that he had been taking nausea medication earlier today.  He was seen 2 weeks ago with similar abdominal pain over the last 2 months, has had gastroenteritis, versus other unspecified cause of abdominal pain.  Reports that he had been pain-free until the pain returned today.   Emesis      Home Medications Prior to Admission medications   Medication Sig Start Date End Date Taking? Authorizing Provider  metoCLOPramide (REGLAN) 10 MG tablet Take 1 tablet (10 mg total) by mouth every 6 (six) hours. Patient taking differently: Take 10 mg by mouth as needed for nausea or vomiting. 12/02/22  Yes Roxy Horseman, PA-C  pantoprazole (PROTONIX) 40 MG tablet Take 1 tablet (40 mg total) by mouth daily. 12/16/22  Yes Jacoby Zanni H, PA-C  sucralfate (CARAFATE) 1 g tablet Take 1 tablet (1 g total) by mouth 4 (four) times daily -  with meals and at bedtime. Patient not taking: Reported on 12/16/2022 09/17/22   Raspet, Noberto Retort, PA-C      Allergies    Patient has no known allergies.    Review of Systems   Review of Systems  Gastrointestinal:  Positive for vomiting.  All other systems reviewed and are negative.   Physical Exam Updated Vital Signs BP 130/72 (BP Location: Right Arm)   Pulse 67   Temp 98.1 F (36.7 C)   Resp 18   SpO2 100%  Physical Exam Vitals and nursing note reviewed.  Constitutional:      General: He is not in acute distress.    Appearance: Normal appearance.     Comments: Patient appears somewhat uncomfortable, shivering  when initially assessed, however he is in a hallway bed near the open doors of the emergency department blowing cold air  HENT:     Head: Normocephalic and atraumatic.  Eyes:     General:        Right eye: No discharge.        Left eye: No discharge.  Cardiovascular:     Rate and Rhythm: Normal rate and regular rhythm.     Heart sounds: No murmur heard.    No friction rub. No gallop.  Pulmonary:     Effort: Pulmonary effort is normal.     Breath sounds: Normal breath sounds.  Abdominal:     General: Bowel sounds are normal.     Palpations: Abdomen is soft.     Comments: Generalized tenderness throughout the abdomen, no rebound, rigidity, guarding, normal bowel sounds throughout  Skin:    General: Skin is warm and dry.     Capillary Refill: Capillary refill takes less than 2 seconds.  Neurological:     Mental Status: He is alert and oriented to person, place, and time.  Psychiatric:        Mood and Affect: Mood normal.        Behavior: Behavior normal.     ED Results / Procedures / Treatments   Labs (all labs ordered are listed, but only abnormal results are displayed) Labs Reviewed  CBC  WITH DIFFERENTIAL/PLATELET - Abnormal; Notable for the following components:      Result Value   WBC 12.2 (*)    MCV 79.3 (*)    MCH 24.9 (*)    Neutro Abs 10.0 (*)    All other components within normal limits  COMPREHENSIVE METABOLIC PANEL - Abnormal; Notable for the following components:   Glucose, Bld 116 (*)    Total Bilirubin 1.3 (*)    All other components within normal limits  LIPASE, BLOOD  URINALYSIS, ROUTINE W REFLEX MICROSCOPIC    EKG None  Radiology US Abdomen Limited RUQ (LIVER/GB)  Result Date: 12/16/2022 CLINICAL DATA:  Right upper quadrant pain EXAM: ULTRASOUND ABDOMEN LIMITED RIGHT UPPER QUADRANT COMPARISON:  Ultrasound 12/02/2022. FINDINGS: Gallbladder: Gallbladder is underdistended. No obvious stones. Gallbladder wall is slightly thickened but nonspecific in  the presence of the level of distention. Common bile duct: Diameter: 4 mm Liver: No focal lesion identified. Within normal limits in parenchymal echogenicity. Portal vein is patent on color Doppler imaging with normal direction of blood flow towards the liver. Other: None. IMPRESSION: Underdistended gallbladder. No obvious stones. No ductal dilatation Electronically Signed   By: Karen Kays M.D.   On: 12/16/2022 16:33    Procedures Procedures    Medications Ordered in ED Medications  ondansetron (ZOFRAN) injection 4 mg (4 mg Intravenous Given 12/16/22 1509)  morphine (PF) 4 MG/ML injection 4 mg (4 mg Intravenous Given 12/16/22 1509)  alum & mag hydroxide-simeth (MAALOX/MYLANTA) 200-200-20 MG/5ML suspension 30 mL (30 mLs Oral Given 12/16/22 1545)    And  lidocaine (XYLOCAINE) 2 % viscous mouth solution 15 mL (15 mLs Oral Given 12/16/22 1545)  dicyclomine (BENTYL) capsule 10 mg (10 mg Oral Given 12/16/22 1545)  morphine (PF) 4 MG/ML injection 4 mg (4 mg Intravenous Given 12/16/22 1545)    ED Course/ Medical Decision Making/ A&P                                 Medical Decision Making  This patient is a 22 y.o. male  who presents to the ED for concern of abdominal pain.   Differential diagnoses prior to evaluation: The emergent differential diagnosis includes, but is not limited to,  The causes of generalized abdominal pain include but are not limited to AAA, mesenteric ischemia, appendicitis, diverticulitis, DKA, gastritis, gastroenteritis, AMI, nephrolithiasis, pancreatitis, peritonitis, adrenal insufficiency,lead poisoning, iron toxicity, intestinal ischemia, constipation, UTI,SBO/LBO, splenic rupture, biliary disease, IBD, IBS, PUD, or hepatitis. This is not an exhaustive differential.   Past Medical History / Co-morbidities / Social History: Overall noncontributory  Additional history: Chart reviewed. Pertinent results include: reviewed labwork and imaging from recent previous ED  visits for similar abd pain -- has had gall bladder findings which were borderline but without acute cholecystitis  Physical Exam: Physical exam performed. The pertinent findings include: Patient appears somewhat uncomfortable, shivering when initially assessed, however he is in a hallway bed near the open doors of the emergency department blowing cold air   Generalized tenderness throughout the abdomen, no rebound, rigidity, guarding, normal bowel sounds throughout   Lab Tests/Imaging studies: I personally interpreted labs/imaging and the pertinent results include: CBC notable for mild leukocytosis, white blood cells 12.2, CMP with mildly elevated total bilirubin, actually decreased from recent baseline.  Lipase normal.  Patient did not provide urine sample but is not having any urinary symptoms.  Right upper quadrant ultrasound shows somewhat decompressed gallbladder but no evidence  of acute pathology, no cholecystitis, no cholelithiasis. I agree with the radiologist interpretation.  Medications: I ordered medication including morphine, Zofran, GI cocktail, Bentyl for abdominal pain.  I have reviewed the patients home medicines and have made adjustments as needed.   Disposition: After consideration of the diagnostic results and the patients response to treatment, I feel that patient feeling significantly improved, tolerating p.o., discharged with new Protonix prescription, encourage close GI follow-up patient understands and agrees to plan.   emergency department workup does not suggest an emergent condition requiring admission or immediate intervention beyond what has been performed at this time. The plan is: as above. The patient is safe for discharge and has been instructed to return immediately for worsening symptoms, change in symptoms or any other concerns.  Final Clinical Impression(s) / ED Diagnoses Final diagnoses:  Nausea and vomiting, unspecified vomiting type  Abdominal pain,  unspecified abdominal location    Rx / DC Orders ED Discharge Orders          Ordered    pantoprazole (PROTONIX) 40 MG tablet  Daily        12/16/22 1734              Olene Floss, PA-C 12/16/22 1735    Melene Plan, DO 12/16/22 1858

## 2022-12-16 NOTE — ED Notes (Addendum)
Patient wheeled to restroom and was able to transfer to toilet independently. Patient is weak and tremulous. Patient reports he took medication (reglan) to help with N/V which made him worse and called 911. EMS administered zofran which helped symptoms. Provider notes from this morning indicate patient was asymptomatic at 1030am.

## 2022-12-16 NOTE — ED Triage Notes (Signed)
Patient BIB [facility] from home for vomiting x 1 hr with general abdominal pain. EMS administered 4mg  zofran en route with immediate relief. Patient A&Ox4, ambulatory, VSS. 18 L AC

## 2022-12-16 NOTE — ED Notes (Signed)
Pt's no longer nauseous, but is having sever stomach pain.  Pt had these same symptoms two weeks and had been doing better.  Pt noted that he has been having stomach issues for several months.  Pt was given more blankets due to being cold.

## 2022-12-16 NOTE — Discharge Instructions (Signed)
You can use over the counter maalox, mylanta for stomach irritation. Home medications already prescribed in the past. I have prescribed a new acid reducing medication, I recommend taking it daily.

## 2022-12-17 ENCOUNTER — Encounter: Payer: Self-pay | Admitting: Family Medicine

## 2022-12-17 ENCOUNTER — Other Ambulatory Visit: Payer: Self-pay | Admitting: Family Medicine

## 2022-12-17 DIAGNOSIS — R1084 Generalized abdominal pain: Secondary | ICD-10-CM | POA: Insufficient documentation

## 2022-12-17 DIAGNOSIS — Z83430 Family history of elevated lipoprotein(a): Secondary | ICD-10-CM | POA: Insufficient documentation

## 2022-12-17 DIAGNOSIS — Z0000 Encounter for general adult medical examination without abnormal findings: Secondary | ICD-10-CM | POA: Insufficient documentation

## 2022-12-17 DIAGNOSIS — Z1159 Encounter for screening for other viral diseases: Secondary | ICD-10-CM | POA: Insufficient documentation

## 2022-12-17 DIAGNOSIS — Z833 Family history of diabetes mellitus: Secondary | ICD-10-CM | POA: Insufficient documentation

## 2022-12-17 DIAGNOSIS — Z21 Asymptomatic human immunodeficiency virus [HIV] infection status: Secondary | ICD-10-CM

## 2022-12-17 DIAGNOSIS — Z114 Encounter for screening for human immunodeficiency virus [HIV]: Secondary | ICD-10-CM | POA: Insufficient documentation

## 2022-12-17 DIAGNOSIS — Z7689 Persons encountering health services in other specified circumstances: Secondary | ICD-10-CM | POA: Insufficient documentation

## 2022-12-17 LAB — CMP14+EGFR
ALT: 26 IU/L (ref 0–44)
AST: 22 IU/L (ref 0–40)
Albumin: 4.3 g/dL (ref 4.3–5.2)
Alkaline Phosphatase: 68 IU/L (ref 44–121)
BUN/Creatinine Ratio: 12 (ref 9–20)
BUN: 10 mg/dL (ref 6–20)
Bilirubin Total: 0.8 mg/dL (ref 0.0–1.2)
CO2: 26 mmol/L (ref 20–29)
Calcium: 9.4 mg/dL (ref 8.7–10.2)
Chloride: 104 mmol/L (ref 96–106)
Creatinine, Ser: 0.83 mg/dL (ref 0.76–1.27)
Globulin, Total: 3.1 g/dL (ref 1.5–4.5)
Glucose: 81 mg/dL (ref 70–99)
Potassium: 4.2 mmol/L (ref 3.5–5.2)
Sodium: 141 mmol/L (ref 134–144)
Total Protein: 7.4 g/dL (ref 6.0–8.5)
eGFR: 126 mL/min/1.73

## 2022-12-17 LAB — HIV 1/2 AB DIFFERENTIATION
HIV 1 Ab: REACTIVE
HIV 2 Ab: NONREACTIVE
NOTE (HIV CONF MULTIP: POSITIVE — AB

## 2022-12-17 LAB — LIPID PANEL
Chol/HDL Ratio: 3.7 ratio (ref 0.0–5.0)
Cholesterol, Total: 125 mg/dL (ref 100–199)
HDL: 34 mg/dL — ABNORMAL LOW (ref >39)
LDL Chol Calc (NIH): 70 mg/dL (ref 0–99)
Triglycerides: 116 mg/dL (ref 0–149)
VLDL Cholesterol Cal: 21 mg/dL (ref 5–40)

## 2022-12-17 LAB — HEMOGLOBIN A1C
Est. average glucose Bld gHb Est-mCnc: 111 mg/dL
Hgb A1c MFr Bld: 5.5 % (ref 4.8–5.6)

## 2022-12-17 LAB — CBC
Hematocrit: 40.3 % (ref 37.5–51.0)
Hemoglobin: 13 g/dL (ref 13.0–17.7)
MCH: 25.6 pg — ABNORMAL LOW (ref 26.6–33.0)
MCHC: 32.3 g/dL (ref 31.5–35.7)
MCV: 80 fL (ref 79–97)
Platelets: 206 10*3/uL (ref 150–450)
RBC: 5.07 x10E6/uL (ref 4.14–5.80)
RDW: 14.2 % (ref 11.6–15.4)
WBC: 2.6 10*3/uL — ABNORMAL LOW (ref 3.4–10.8)

## 2022-12-17 LAB — HEPATITIS C ANTIBODY: Hep C Virus Ab: NONREACTIVE

## 2022-12-17 LAB — HIV ANTIBODY (ROUTINE TESTING W REFLEX): HIV Screen 4th Generation wRfx: REACTIVE

## 2022-12-18 ENCOUNTER — Other Ambulatory Visit: Payer: Medicaid Other

## 2022-12-18 DIAGNOSIS — Z21 Asymptomatic human immunodeficiency virus [HIV] infection status: Secondary | ICD-10-CM

## 2022-12-19 LAB — HIV-1 RNA QUANT-NO REFLEX-BLD
HIV-1 RNA Viral Load Log: 5.086 {Log}
HIV-1 RNA Viral Load: 122000 {copies}/mL

## 2022-12-21 NOTE — Progress Notes (Unsigned)
HPI: Calvin Martinez is a 22 y.o. male who presents to the [facility] clinic for initiation of ART for newly diagnosed HIV infection.  Patient Active Problem List   Diagnosis Date Noted   Encounter for general adult medical examination w/o abnormal findings 12/17/2022   Establishing care with new doctor, encounter for 12/17/2022   Encounter for hepatitis C screening test for low risk patient 12/17/2022   Encounter for screening for HIV 12/17/2022   Generalized abdominal pain 12/17/2022   Family history of diabetes mellitus in first degree relative 12/17/2022   Family history of elevated lipoprotein (a) 12/17/2022    Patient's Medications  New Prescriptions   No medications on file  Previous Medications   METOCLOPRAMIDE (REGLAN) 10 MG TABLET    Take 1 tablet (10 mg total) by mouth every 6 (six) hours.   PANTOPRAZOLE (PROTONIX) 40 MG TABLET    Take 1 tablet (40 mg total) by mouth daily.   SUCRALFATE (CARAFATE) 1 G TABLET    Take 1 tablet (1 g total) by mouth 4 (four) times daily -  with meals and at bedtime.  Modified Medications   No medications on file  Discontinued Medications   No medications on file    Labs: Lab Results  Component Value Date   HIV1RNAVL 122,000 12/18/2022    RPR and STI No results found for: "LABRPR", "RPRTITER"      No data to display          Hepatitis B No results found for: "HEPBSAB", "HEPBSAG", "HEPBCAB" Hepatitis C No results found for: "HEPCAB", "HCVRNAPCRQN" Hepatitis A No results found for: "HAV" Lipids: Lab Results  Component Value Date   CHOL 125 12/16/2022   TRIG 116 12/16/2022   HDL 34 (L) 12/16/2022   CHOLHDL 3.7 12/16/2022   LDLCALC 70 12/16/2022     Assessment: Calvin Martinez presents to the clinic to establish care for their newly diagnosed HIV infection. He was seen by Arvilla Meres, PA-C this afternoon. Patient tested positive on 12/16/22, with viral load of 122,000. Medications reviewed; no drug interactions were found.  He will be starting Dovato. Introduced him to the pharmacy team.  Explained that Calvin Martinez is a one pill once daily medication with or without food and the importance of not missing any doses. Explained resistance and how it develops and why it is so important to take Dovato daily and not skip days or doses. Counseled patient to take it around the same time each day. Counseled on what to do if dose is missed, if closer to missed dose take immediately, if closer to next dose then skip and resume normal schedule.   Cautioned on possible side effects the first week or so including nausea, diarrhea, dizziness, and headaches but that they should resolve after the first couple of weeks. Counseled patient to separate Dovato from divalent cations including multivitamins. Discussed with patient to call clinic if Calvin Martinez starts a new medication or herbal supplement. I gave the patient my card and told Lorenza to call me with any issues/questions/concerns.  Calvin Martinez is currently insured. He would like Dovato to be sent to [facility] on [address]. He expressed some interest in [place]. Discussed what this entails, as well as need for viral suppression prior to administration. He would like to consider this in the future.  Plan: - Dovato sent to [facility] on [address] [facility] - Contact us with any questions or concerns  [name], PharmD PGY-2 Infectious Diseases Pharmacy Resident [facility] 12/22/2022 2:29 PM

## 2022-12-22 ENCOUNTER — Other Ambulatory Visit: Payer: Self-pay

## 2022-12-22 ENCOUNTER — Other Ambulatory Visit (HOSPITAL_COMMUNITY): Payer: Self-pay

## 2022-12-22 ENCOUNTER — Encounter: Payer: Self-pay | Admitting: Physician Assistant

## 2022-12-22 ENCOUNTER — Ambulatory Visit (INDEPENDENT_AMBULATORY_CARE_PROVIDER_SITE_OTHER): Payer: Medicaid Other | Admitting: Pharmacist

## 2022-12-22 ENCOUNTER — Ambulatory Visit (INDEPENDENT_AMBULATORY_CARE_PROVIDER_SITE_OTHER): Payer: Medicaid Other | Admitting: Physician Assistant

## 2022-12-22 ENCOUNTER — Other Ambulatory Visit (HOSPITAL_COMMUNITY)
Admission: RE | Admit: 2022-12-22 | Discharge: 2022-12-22 | Disposition: A | Discharge status: Home / Self Care | Payer: Medicaid Other | Source: Ambulatory Visit | Attending: Physician Assistant | Admitting: Physician Assistant

## 2022-12-22 ENCOUNTER — Telehealth: Payer: Self-pay

## 2022-12-22 VITALS — BP 130/70 | HR 68 | Temp 98.0°F | Resp 16 | Ht 74.0 in | Wt 154.2 lb

## 2022-12-22 DIAGNOSIS — Z21 Asymptomatic human immunodeficiency virus [HIV] infection status: Secondary | ICD-10-CM

## 2022-12-22 HISTORY — DX: Asymptomatic human immunodeficiency virus (hiv) infection status: Z21

## 2022-12-22 MED ORDER — DOVATO 50-300 MG PO TABS: 1.0000 | ORAL_TABLET | Freq: Every day | ORAL | 5 refills | Status: DC

## 2022-12-22 MED ORDER — HYDROXYZINE PAMOATE 50 MG PO CAPS: 50.0000 mg | ORAL_CAPSULE | Freq: Every day | ORAL | 0 refills | Status: DC

## 2022-12-22 NOTE — Telephone Encounter (Signed)
[facility] Patient Advocate Encounter  Insurance verification completed.    The patient is insured through  [employer] . Patient has Medicare and is not eligible for a copay card, but may be able to apply for patient assistance, if available.    Ran test claim for DESCOVY, TRUVADA, APRETUDE, BIKTARVY, SYMTUZA, DOVATO  The current 30 day co-pay is $0.  We will continue to follow to see if copay assistance is needed.  This test claim was processed through [facility]- copay amounts may vary at other pharmacies due to pharmacy/plan contracts, or as the patient moves through the different stages of their insurance plan.

## 2022-12-22 NOTE — Progress Notes (Signed)
Subjective:    Patient ID: Calvin Martinez, male    DOB: 2000/08/26, 22 y.o.   MRN: [id number]  Chief Complaint  Patient presents with   New Patient (Initial Visit)    B20    12/16/22 Labs VL 122,000, HCV ab non reactive;  HPI: Calvin Martinez is a 22 y.o. male presents to establish care for newly diagnosed HIV-1 on 16 Dec 2022. He has no previous history of HIV testing and has had no previous PrEP or PEP use. Risk factors: bisexual contact.  Cisgender male. He has been sexually active with his current girlfriend for 1 year.  In July 2024 he did have sexual contact with an anonymous male without a condom, she was not a sex Financial controller.  Had male partners over a year ago.   He reports intermittent GI symptoms since 09/17/22.  ED visits for nausea, vomiting, abdominal pain 8/15,8/27,8/30,11/13 provided metoclopramide, protonix, zofran with minimal relief.  He has had no further GI symptoms since 12/16/22. He has been following a BRAT diet, however since he was notified regarding his HIV status on 12/21/22 he has been unable to eat or sleep.  His anxiety has been high since being notified.  He has never been treated for anxiety, but has previously struggled with anxiety without treatment.  He has a GI appt with [facility] on Feb 04, 2023. He denies any fever, rash, chills, significant weight loss, HA, visual changes, neck pain,edema, constipation, rectal pain, urinary symptoms, STI symptoms.  He is a [place] native, no travel outside of [place] region. 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  He has support from mother.  He has no contact with father.  His girlfriend was tested and has been non reactive for HIV. He has no pets. He has supportive and loving mother who he has already notified and he has her full support.   No surgical history No other chronic medications No significant medical history No illicit drug  use.     No Known Allergies    Outpatient Medications Prior to Visit  Medication Sig Dispense Refill   metoCLOPramide (REGLAN) 10 MG tablet Take 1 tablet (10 mg total) by mouth every 6 (six) hours. (Patient not taking: Reported on 12/22/2022) 30 tablet 0   pantoprazole (PROTONIX) 40 MG tablet Take 1 tablet (40 mg total) by mouth daily. (Patient not taking: Reported on 12/22/2022) 30 tablet 0   sucralfate (CARAFATE) 1 g tablet Take 1 tablet (1 g total) by mouth 4 (four) times daily -  with meals and at bedtime. (Patient not taking: Reported on 12/16/2022) 28 tablet 0   No facility-administered medications prior to visit.     History reviewed. No pertinent past medical history.   History reviewed. No pertinent surgical history.     Review of Systems  Constitutional:  Positive for appetite change. Negative for unexpected weight change.  HENT:  Negative for congestion, dental problem, ear pain, hearing loss, mouth sores, sneezing, sore throat and trouble swallowing.   Eyes:  Negative for photophobia and visual disturbance.  Respiratory:  Negative for cough, shortness of breath and wheezing.   Cardiovascular:  Negative for chest pain, palpitations and leg swelling.  Gastrointestinal:  Negative for abdominal pain, diarrhea, nausea and vomiting.  Endocrine: Negative for polyuria.  Genitourinary: Negative.   Musculoskeletal:  Negative for arthralgias, gait problem, myalgias and neck stiffness.  Skin:  Negative for rash.  Allergic/Immunologic:  Positive for immunocompromised state.  Neurological:  Negative for dizziness, seizures, syncope, weakness, light-headedness and headaches.  Hematological:  Negative for adenopathy.  Psychiatric/Behavioral:  Negative for agitation, dysphoric mood, self-injury, sleep disturbance and suicidal ideas. The patient is nervous/anxious.        Appeared anxious and tears throughout the discussion and examination, appropriate      Objective:    BP  130/70   Pulse 68   Temp 98 F (36.7 C) (Temporal)   Resp 16   Ht 6\' 2"  (1.88 m)   Wt 154 lb 3.2 oz (69.9 kg)   SpO2 98%   BMI 19.80 kg/m  Nursing note and vital signs reviewed.  Physical Exam Vitals reviewed.  Constitutional:      General: He is not in acute distress.    Appearance: Normal appearance. He is normal weight. He is not ill-appearing, toxic-appearing or diaphoretic.     Comments: Tearful at times, appears anxious and fidgety  HENT:     Head: Normocephalic and atraumatic.     Nose: Nose normal.     Mouth/Throat:     Mouth: Mucous membranes are moist.     Pharynx: Oropharynx is clear. No oropharyngeal exudate or posterior oropharyngeal erythema.  Eyes:     Extraocular Movements: Extraocular movements intact.     Conjunctiva/sclera: Conjunctivae normal.     Pupils: Pupils are equal, round, and reactive to light.  Cardiovascular:     Rate and Rhythm: Normal rate and regular rhythm.     Pulses: Normal pulses.     Heart sounds: Normal heart sounds. No murmur heard.    No friction rub. No gallop.  Pulmonary:     Effort: Pulmonary effort is normal. No respiratory distress.     Breath sounds: Normal breath sounds. No stridor. No wheezing, rhonchi or rales.  Chest:     Chest wall: No tenderness.  Abdominal:     General: Abdomen is flat. Bowel sounds are normal. There is no distension.     Palpations: Abdomen is soft. There is no mass.     Tenderness: There is no abdominal tenderness. There is no right CVA tenderness, left CVA tenderness, guarding or rebound.     Hernia: No hernia is present.  Musculoskeletal:        General: Normal range of motion.  Lymphadenopathy:     Cervical: No cervical adenopathy.  Skin:    General: Skin is warm and dry.  Neurological:     General: No focal deficit present.     Mental Status: He is alert and oriented to person, place, and time. Mental status is at baseline.  Psychiatric:        Attention and Perception: Attention and  perception normal. He is attentive. He does not perceive auditory or visual hallucinations.        Mood and Affect: Mood is anxious. Affect is tearful.        Speech: Speech normal.        Behavior: Behavior normal. Behavior is cooperative.        Thought Content: Thought content normal.        Cognition and Memory: Cognition and memory normal.        Judgment: Judgment normal.         12/16/2022   10:48 AM  Depression screen PHQ 2/9  Decreased Interest 0  Down, Depressed, Hopeless 0  PHQ - 2 Score 0  Altered sleeping 0  Tired, decreased energy 0  Change in appetite  0  Feeling bad or failure about yourself  0  Trouble concentrating 2  Moving slowly or fidgety/restless 0  Suicidal thoughts 0  PHQ-9 Score 2  Difficult doing work/chores Somewhat difficult       Assessment & Plan:  Reviewed ED notes 8/15,8/27,8/31,10/11,10/30,11/13 regarding GI symptoms. No current GI related symptoms during exam today.  PHQ-9 score of 2  HIV-1: VL 12/16/22 122,000. Discussed U=U. Advised to avoid sexual contact with girlfriend until undetectable for atleast 3 months, condoms are always recommended for harm reduction. Avoid sharing razors, tooth brushes, follow healthy diet and get plenty of sleep, avoid handling dirt (gardening) keep hands clean and washed. Girlfriend is aware of status and has been tested and was non reactive.  -Starting Dovato today, Medicaid will cover, one pill once daily, with or without food, encouraged to not miss doses and set up a reminder in his phone.  Discussed reasons to be compliant  and resistance as potential impact of non adherence.  Pharmacy discussed DOVATO in clinic today with patient Labs drawn today Follow up in 1 month  Patient Active Problem List   Diagnosis Date Noted   HIV-1 (human immunodeficiency virus I) (HCC) 12/22/2022   Encounter for general adult medical examination w/o abnormal findings 12/17/2022   Establishing care with new doctor, encounter  for 12/17/2022   Encounter for hepatitis C screening test for low risk patient 12/17/2022   Encounter for screening for HIV 12/17/2022   Generalized abdominal pain 12/17/2022   Family history of diabetes mellitus in first degree relative 12/17/2022   Family history of elevated lipoprotein (a) 12/17/2022     Problem List Items Addressed This Visit     HIV-1 (human immunodeficiency virus I) (HCC) - Primary   Relevant Medications   dolutegravir-lamiVUDine (DOVATO) 50-300 MG tablet   hydrOXYzine (VISTARIL) 50 MG capsule   Other Relevant Orders   RPR   HEPATITIS B SURFACE ANTIGEN   HEPATITIS B SURFACE ANTIBODY   HEPATITIS B CORE ANTIBODY, TOTAL   HEPATITIS A ANTIBODY, TOTAL   HIV-1 RNA ULTRAQUANT REFLEX TO GENTYP+   URINE CYTOLOGY ANCILLARY ONLY   HLA B*5701   QuantiFERON-TB Gold Plus   Cryptococcal Ag, Ltx Scr Rflx Titer   Toxoplasma gondii antibody, IgG   Cytomegalovirus antibody, IgG   T-helper cells (CD4) count (not at [facility])     I have discontinued Allie F. Dolby's sucralfate, metoCLOPramide, and pantoprazole. I am also having him start on Dovato and hydrOXYzine.   Meds ordered this encounter  Medications   dolutegravir-lamiVUDine (DOVATO) 50-300 MG tablet    Sig: Take 1 tablet by mouth daily.    Dispense:  30 tablet    Refill:  5    Order Specific Question:   Supervising Provider    Answer:   VAN DAM, CORNELIUS N [[id number]]   hydrOXYzine (VISTARIL) 50 MG capsule    Sig: Take 1 capsule (50 mg total) by mouth at bedtime.    Dispense:  30 capsule    Refill:  0    Order Specific Question:   Supervising Provider    Answer:   VAN DAM, CORNELIUS N [[id number]]     Follow-up: Return in about 4 weeks (around 01/19/2023) for B20 with another provider.

## 2022-12-22 NOTE — Patient Instructions (Signed)
Nice meeting you today Calvin Martinez provided today, I advise you to avoid sexual activity until virus is undetectable. Once virus is undetectable for 3 months you will no longer be able to transmit virus to any partners.  You should continue to use Martinez to protect against other sexually transmitted infections.   You will be starting Dovato today.  This is one pill once daily, with or without food.  It is not uncommon to experience some diarrhea initially with this medication for up to 2 weeks-these symptoms will resolve. Due to your current GI symptoms, I suspect this is related to the HIV virus, these will resolve as well as you start your antiretroviral therapy (Dovato), I am screening you for other etiolgies for your GI symptoms as well.  Continue to eat easily digetable food, bananas, rice, chicken, apple sauce, toast, avoid greasy food  Do not share razors or tooth brushes. Keep hands washed and away from mouth or eyes.  Avoid gardening or handling pets until we can improve immune system with the Dovato. Avoid being around anyone with any illnesses.  This is to protect you.  Follow up in 4 weeks If you have any follow up questions do not hesitate to MyChart me.   If you would like to consider counseling/therapy I am happy to send in a referral to some excellent counselors.   For now try to take care of y

## 2022-12-23 ENCOUNTER — Ambulatory Visit: Payer: Self-pay

## 2022-12-23 LAB — URINE CYTOLOGY ANCILLARY ONLY
Chlamydia: NEGATIVE
Comment: NEGATIVE
Comment: NORMAL
Neisseria Gonorrhea: NEGATIVE

## 2022-12-23 NOTE — Telephone Encounter (Signed)
Pt's mother called, she is not with pt and not on DPR. Advised I would call pt. Called pt, LVMTCB to speak with NT about sx.   Summary: anxiety/not eating or sleeping   Patient's mother Calvin Martinez has called and states that she is a patient of Dr Alvis Lemmings ([name]). Patient's mother states he was seen at [facility], diagnosed with HIV, and she is not happy with how they treated him about this. Patient's mother would like to get patient in for a new patient with Dr Alvis Lemmings but Dr Alvis Lemmings is not until April of next year. Patient's mother would like a call back to see if patient can be worked in sooner for this, patient's mother needs Dr Alvis Lemmings to provide care for his HIV and treatment. Patient's mother has concerns and was crying as well. Patient's mother is not with patient.   Patient's mother states patient is having anxiety, he is not sleeping, eating, he is crying in regards to this.   Patients callback # [phone] (Patient dropped his phone and phone got wet, he may have to call back, leave a detailed voicemail so he can call back)   Patient's mother callback # [phone] Calvin Martinez)

## 2022-12-23 NOTE — Telephone Encounter (Signed)
Message from Lennox Pippins sent at 12/23/2022 12:06 PM EST  Summary: anxiety/not eating or sleeping   Patient's mother Kenney Houseman has called and states that she is a patient of Dr Alvis Lemmings ([name]). Patient's mother states he was seen at [facility], diagnosed with HIV, and she is not happy with how they treated him about this. Patient's mother would like to get patient in for a new patient with Dr Alvis Lemmings but Dr Alvis Lemmings is not until April of next year. Patient's mother would like a call back to see if patient can be worked in sooner for this, patient's mother needs Dr Alvis Lemmings to provide care for his HIV and treatment. Patient's mother has concerns and was crying as well. Patient's mother is not with patient.   Patient's mother states patient is having anxiety, he is not sleeping, eating, he is crying in regards to this.   Patients callback # [phone] (Patient dropped his phone and phone got wet, he may have to call back, leave a detailed voicemail so he can call back)   Patient's mother callback # [phone] (Tanya)        Called pt and LM on VM .

## 2022-12-23 NOTE — Telephone Encounter (Signed)
Called pt several times but per mom his phone is not working correctly. Spoke to pt's mother at length about her observations and her desire to have pt followed by her doctor, Dr. Alvis Lemmings.  Advised Dr. Baxter Flattery first NP appt is in April. Offered her the community practices as per protocol. Pt's mother adamant that she only wants her son to go to Dr Alvis Lemmings. Sent note to Cassandra at [facility] about current issue and wanting to be established with Dr. Alvis Lemmings.  Pt's mother stated she didn't like how the dx was told and no education or counseling offered. She said her son already has depression and anxiety and he is not sleeping well. She denied him saying anything about suicide. "Fuck this life" He cries and not eating well. He is currently working at a [employer].    Message from Lennox Pippins sent at 12/23/2022 12:06 PM EST  Summary: anxiety/not eating or sleeping   Patient's mother Kenney Houseman has called and states that she is a patient of Dr Alvis Lemmings ([name]). Patient's mother states he was seen at [facility], diagnosed with HIV, and she is not happy with how they treated him about this. Patient's mother would like to get patient in for a new patient with Dr Alvis Lemmings but Dr Alvis Lemmings is not until April of next year. Patient's mother would like a call back to see if patient can be worked in sooner for this, patient's mother needs Dr Alvis Lemmings to provide care for his HIV and treatment. Patient's mother has concerns and was crying as well. Patient's mother is not with patient.   Patient's mother states patient is having anxiety, he is not sleeping, eating, he is crying in regards to this.   Patients callback # [phone] (Patient dropped his phone and phone got wet, he may have to call back, leave a detailed voicemail so he can call back)   Patient's mother callback # [phone] Kenney Houseman)         Answer Assessment - Initial Assessment Questions 1. CONCERN: "Did anything happen that  prompted you to call today?"      Dx HIV , future  2. ANXIETY SYMPTOMS: "Can you describe how you (your loved one; patient) have been feeling?" (e.g., tense, restless, panicky, anxious, keyed up, overwhelmed, sense of impending doom).      Crying, not sleeping and not eating well, racing  3. ONSET: "How long have you been feeling this way?" (e.g., hours, days, weeks)     Last Wednesday  4. SEVERITY: "How would you rate the level of anxiety?" (e.g., 0 - 10; or mild, moderate, severe).     Worse at night 5. FUNCTIONAL IMPAIRMENT: "How have these feelings affected your ability to do daily activities?" "Have you had more difficulty than usual doing your normal daily activities?" (e.g., getting better, same, worse; self-care, school, work, interactions)     Self care 6. HISTORY: "Have you felt this way before?" "Have you ever been diagnosed with an anxiety problem in the past?" (e.g., generalized anxiety disorder, panic attacks, PTSD). If Yes, ask: "How was this problem treated?" (e.g., medicines, counseling, etc.)     H/o depression and anxiety last week 7. RISK OF HARM - SUICIDAL IDEATION: "Do you ever have thoughts of hurting or killing yourself?" If Yes, ask:  "Do you have these feelings now?" "Do you have a plan on how you would do this?"     Mother not with pt 8. TREATMENT:  "What has been done  so far to treat this anxiety?" (e.g., medicines, relaxation strategies). "What has helped?"     Pt want 9. TREATMENT - THERAPIST: "Do you have a counselor or therapist? Name?"     no 10. POTENTIAL TRIGGERS: "Do you drink caffeinated beverages (e.g., coffee, colas, teas), and how much daily?" "Do you drink alcohol or use any drugs?" "Have you started any new medicines recently?"       Dx of HIV last Wednesday- pt was notified at work and "no energy" 11. PATIENT SUPPORT: "Who is with you now?" "Who do you live with?" "Do you have family or friends who you can talk to?"        Pt is at work has mom, sister  "Fuck my life" Denies SI when discussed  12. OTHER SYMPTOMS: "Do you have any other symptoms?" (e.g., feeling depressed, trouble concentrating, trouble sleeping, trouble breathing, palpitations or fast heartbeat, chest pain, sweating, nausea, or diarrhea)       Depressed trouble sleepiness 13. PREGNANCY: "Is there any chance you are pregnant?" "When was your last menstrual period?"       N/a  Protocols used: Anxiety and Panic Attack-A-AH

## 2022-12-23 NOTE — Telephone Encounter (Signed)
Flat affect. Been normal. Denied suicidal thoughts or ideation. Promised safety plan to tell mother or sister or to call 911 if having them. Discussed needing to get counselor and in the resources sent via MyChart to pt and mother those were included. Waiting to hear back if Dr Alvis Lemmings will take pt on

## 2022-12-26 ENCOUNTER — Encounter (HOSPITAL_COMMUNITY): Payer: Self-pay | Admitting: *Deleted

## 2022-12-26 ENCOUNTER — Emergency Department (HOSPITAL_COMMUNITY)
Admission: EM | Admit: 2022-12-26 | Discharge: 2022-12-27 | Disposition: A | Discharge status: Home / Self Care | Payer: Medicaid Other | Attending: Emergency Medicine | Admitting: Emergency Medicine

## 2022-12-26 ENCOUNTER — Other Ambulatory Visit: Payer: Self-pay

## 2022-12-26 DIAGNOSIS — Z21 Asymptomatic human immunodeficiency virus [HIV] infection status: Secondary | ICD-10-CM | POA: Diagnosis not present

## 2022-12-26 DIAGNOSIS — R112 Nausea with vomiting, unspecified: Secondary | ICD-10-CM | POA: Insufficient documentation

## 2022-12-26 DIAGNOSIS — R197 Diarrhea, unspecified: Secondary | ICD-10-CM | POA: Insufficient documentation

## 2022-12-26 DIAGNOSIS — R109 Unspecified abdominal pain: Secondary | ICD-10-CM | POA: Diagnosis not present

## 2022-12-26 LAB — COMPREHENSIVE METABOLIC PANEL
ALT: 23 U/L (ref 0–44)
AST: 33 U/L (ref 15–41)
Albumin: 4.2 g/dL (ref 3.5–5.0)
Alkaline Phosphatase: 64 U/L (ref 38–126)
Anion gap: 11 (ref 5–15)
BUN: 11 mg/dL (ref 6–20)
CO2: 23 mmol/L (ref 22–32)
Calcium: 9.1 mg/dL (ref 8.9–10.3)
Chloride: 104 mmol/L (ref 98–111)
Creatinine, Ser: 0.93 mg/dL (ref 0.61–1.24)
GFR, Estimated: >60 mL/min (ref >60)
Glucose, Bld: 107 mg/dL — ABNORMAL HIGH (ref 70–99)
Potassium: 3.7 mmol/L (ref 3.5–5.1)
Sodium: 138 mmol/L (ref 135–145)
Total Bilirubin: 1.5 mg/dL — ABNORMAL HIGH (ref <1.2)
Total Protein: 7.5 g/dL (ref 6.5–8.1)

## 2022-12-26 LAB — CBC
HCT: 39.9 % (ref 39.0–52.0)
Hemoglobin: 12.8 g/dL — ABNORMAL LOW (ref 13.0–17.0)
MCH: 25 pg — ABNORMAL LOW (ref 26.0–34.0)
MCHC: 32.1 g/dL (ref 30.0–36.0)
MCV: 77.9 fL — ABNORMAL LOW (ref 80.0–100.0)
Platelets: 251 10*3/uL (ref 150–400)
RBC: 5.12 MIL/uL (ref 4.22–5.81)
RDW: 13.9 % (ref 11.5–15.5)
WBC: 7.5 10*3/uL (ref 4.0–10.5)
nRBC: 0 % (ref 0.0–0.2)

## 2022-12-26 LAB — LIPASE, BLOOD: Lipase: 32 U/L (ref 11–51)

## 2022-12-26 MED ORDER — PANTOPRAZOLE SODIUM 40 MG IV SOLR
40.0000 mg | Freq: Once | INTRAVENOUS | Status: AC
  Administered 2022-12-26: 40 mg via INTRAVENOUS
  Filled 2022-12-26: qty 10

## 2022-12-26 MED ORDER — ONDANSETRON 4 MG PO TBDP: 4.0000 mg | ORAL_TABLET | Freq: Once | ORAL | Status: DC

## 2022-12-26 MED ORDER — ONDANSETRON HCL 4 MG/2ML IJ SOLN
4.0000 mg | Freq: Once | INTRAMUSCULAR | Status: AC
  Administered 2022-12-26: 4 mg via INTRAVENOUS
  Filled 2022-12-26: qty 2

## 2022-12-26 MED ORDER — DICYCLOMINE HCL 10 MG/ML IM SOLN
20.0000 mg | Freq: Once | INTRAMUSCULAR | Status: AC
  Administered 2022-12-27: 20 mg via INTRAMUSCULAR
  Filled 2022-12-26: qty 2

## 2022-12-26 MED ORDER — SODIUM CHLORIDE 0.9 % IV BOLUS
1000.0000 mL | Freq: Once | INTRAVENOUS | Status: AC
  Administered 2022-12-26: 1000 mL via INTRAVENOUS

## 2022-12-26 MED ORDER — FAMOTIDINE IN NACL 20-0.9 MG/50ML-% IV SOLN
20.0000 mg | INTRAVENOUS | Status: AC
  Administered 2022-12-26: 20 mg via INTRAVENOUS
  Filled 2022-12-26: qty 50

## 2022-12-26 NOTE — ED Provider Notes (Signed)
Calvin Martinez Provider Note   CSN: [id number] Arrival date & time: 12/26/22  2120     History {Add pertinent medical, surgical, social history, OB history to HPI:1} Chief Complaint  Patient presents with   Emesis    Calvin Martinez is a 22 y.o. male.  22 year old male with a history of HIV presents to the emergency department for evaluation of nausea and vomiting.  Has been experiencing issues with recurrent nausea, vomiting, abdominal pain for which she has been seen in the emergency department numerous times since August.  Reports that his symptoms this evening began 30 minutes after eating a burger from [employer]. Has had TNTC episodes of vomiting as well as watery diarrhea. No hematemesis, melena, hematochezia, fevers. No prior abdominal surgeries. Did not take any medications PTA.  The history is provided by the patient. No language interpreter was used.  Emesis      Home Medications Prior to Admission medications   Medication Sig Start Date End Date Taking? Authorizing Provider  dolutegravir-lamiVUDine (DOVATO) 50-300 MG tablet Take 1 tablet by mouth daily. 12/22/22   Horton Finer, PA-C  hydrOXYzine (VISTARIL) 50 MG capsule Take 1 capsule (50 mg total) by mouth at bedtime. 12/22/22   Horton Finer, PA-C      Allergies    Patient has no known allergies.    Review of Systems   Review of Systems  Gastrointestinal:  Positive for vomiting.  Ten systems reviewed and are negative for acute change, except as noted in the HPI.    Physical Exam Updated Vital Signs BP (!) 140/72 (BP Location: Left Arm)   Pulse 80   Temp 98.4 F (36.9 C)   Resp 20   Ht 6\' 2"  (1.88 m)   Wt 69.9 kg   SpO2 100%   BMI 19.79 kg/m   Physical Exam Vitals and nursing note reviewed.  Constitutional:      General: He is not in acute distress.    Appearance: He is well-developed. He is not diaphoretic.     Comments: Diaphoretic and  actively vomiting  HENT:     Head: Normocephalic and atraumatic.  Eyes:     General: No scleral icterus.    Conjunctiva/sclera: Conjunctivae normal.  Pulmonary:     Effort: Pulmonary effort is normal. No respiratory distress.     Comments: Respirations even and unlabored Musculoskeletal:        General: Normal range of motion.     Cervical back: Normal range of motion.  Skin:    General: Skin is warm and dry.     Coloration: Skin is not pale.     Findings: No erythema or rash.  Neurological:     Mental Status: He is alert and oriented to person, place, and time.  Psychiatric:        Behavior: Behavior normal.     ED Results / Procedures / Treatments   Labs (all labs ordered are listed, but only abnormal results are displayed) Labs Reviewed  COMPREHENSIVE METABOLIC PANEL - Abnormal; Notable for the following components:      Result Value   Glucose, Bld 107 (*)    Total Bilirubin 1.5 (*)    All other components within normal limits  CBC - Abnormal; Notable for the following components:   Hemoglobin 12.8 (*)    MCV 77.9 (*)    MCH 25.0 (*)    All other components within normal limits  LIPASE, BLOOD  URINALYSIS, ROUTINE W REFLEX MICROSCOPIC    EKG None  Radiology No results found.  Procedures Procedures  {Document cardiac monitor, telemetry assessment procedure when appropriate:1}  Medications Ordered in ED Medications  dicyclomine (BENTYL) injection 20 mg (has no administration in time range)  ondansetron (ZOFRAN) injection 4 mg (4 mg Intravenous Given 12/26/22 2223)  sodium chloride 0.9 % bolus 1,000 mL (0 mLs Intravenous Stopped 12/26/22 2307)  famotidine (PEPCID) IVPB 20 mg premix (20 mg Intravenous New Bag/Given 12/26/22 2315)  pantoprazole (PROTONIX) injection 40 mg (40 mg Intravenous Given 12/26/22 2309)    ED Course/ Medical Decision Making/ A&P Clinical Course as of 12/26/22 2353  Sat Dec 26, 2022  2352 Patient feeling better. Nausea and vomiting  have subsided. Abdomen nontender. No palpable masses, peritoneal signs. Will PO trial. [KH]    Clinical Course User Index [KH] Antony Madura, PA-C   {   Click here for ABCD2, HEART and other calculatorsREFRESH Note before signing :1}                              Medical Decision Making Risk Prescription drug management.   ***  {Document critical care time when appropriate:1} {Document review of labs and clinical decision tools ie heart score, Chads2Vasc2 etc:1}  {Document your independent review of radiology images, and any outside records:1} {Document your discussion with family members, caretakers, and with consultants:1} {Document social determinants of health affecting pt's care:1} {Document your decision making why or why not admission, treatments were needed:1} Final Clinical Impression(s) / ED Diagnoses Final diagnoses:  None    Rx / DC Orders ED Discharge Orders     None

## 2022-12-26 NOTE — ED Triage Notes (Signed)
The pt ate some fast food 30 minutes ago now he has nausea vomiting   he reports that he has this reaction each time he eats fast food currently vomiting in triage

## 2022-12-27 NOTE — Discharge Instructions (Signed)
Avoid fried foods, fatty foods, greasy foods, and milk products until symptoms resolve. Drink plenty of clear liquids. We recommend the use of Zofran for nausea/vomiting. Follow-up with your primary care doctor to ensure resolution of symptoms.

## 2022-12-27 NOTE — ED Notes (Signed)
Pt given Gatorade for po challenge.

## 2023-01-01 LAB — HEPATITIS A ANTIBODY, TOTAL: Hepatitis A AB,Total: NONREACTIVE

## 2023-01-01 LAB — RPR: RPR Ser Ql: NONREACTIVE

## 2023-01-01 LAB — HEPATITIS B SURFACE ANTIGEN: Hepatitis B Surface Ag: NONREACTIVE

## 2023-01-01 LAB — CRYPTOCOCCAL AG, LTX SCR RFLX TITER
Cryptococcal Ag Screen: NOT DETECTED
MICRO NUMBER:: 15752532
SPECIMEN QUALITY:: ADEQUATE

## 2023-01-01 LAB — HIV-1 GENOTYPE: HIV-1 Genotype: DETECTED — AB

## 2023-01-01 LAB — HEPATITIS B SURFACE ANTIBODY,QUALITATIVE: Hep B S Ab: NONREACTIVE

## 2023-01-01 LAB — TOXOPLASMA GONDII ANTIBODY, IGG: Toxoplasma IgG Ratio: <7.2 [IU]/mL

## 2023-01-01 LAB — QUANTIFERON-TB GOLD PLUS
Mitogen-NIL: >10 [IU]/mL
NIL: 0.09 [IU]/mL
QuantiFERON-TB Gold Plus: NEGATIVE
TB1-NIL: 0 [IU]/mL
TB2-NIL: 0 [IU]/mL

## 2023-01-01 LAB — HIV-1 RNA ULTRAQUANT REFLEX TO GENTYP+
HIV 1 RNA Quant: 297000 {copies}/mL — ABNORMAL HIGH
HIV-1 RNA Quant, Log: 5.47 {Log_copies}/mL — ABNORMAL HIGH

## 2023-01-01 LAB — T-HELPER CELLS (CD4) COUNT (NOT AT ARMC)
Absolute CD4: 311 {cells}/uL — ABNORMAL LOW (ref 490–1740)
CD4 T Helper %: 20 % — ABNORMAL LOW (ref 30–61)
Total lymphocyte count: 1553 {cells}/uL (ref 850–3900)

## 2023-01-01 LAB — HLA B*5701: HLA-B*5701 w/rflx HLA-B High: NEGATIVE

## 2023-01-01 LAB — HEPATITIS B CORE ANTIBODY, TOTAL: Hep B Core Total Ab: NONREACTIVE

## 2023-01-01 LAB — CYTOMEGALOVIRUS ANTIBODY, IGG: Cytomegalovirus Ab-IgG: 5.5 U/mL — ABNORMAL HIGH

## 2023-02-04 ENCOUNTER — Ambulatory Visit: Payer: Medicaid Other | Admitting: Nurse Practitioner

## 2023-02-08 ENCOUNTER — Other Ambulatory Visit: Payer: Self-pay

## 2023-02-08 ENCOUNTER — Encounter: Payer: Self-pay | Admitting: Infectious Diseases

## 2023-02-08 ENCOUNTER — Ambulatory Visit: Payer: Medicaid Other | Admitting: Infectious Diseases

## 2023-02-08 VITALS — BP 118/69 | HR 73 | Temp 98.0°F | Ht 73.0 in | Wt 162.0 lb

## 2023-02-08 DIAGNOSIS — B20 Human immunodeficiency virus [HIV] disease: Secondary | ICD-10-CM

## 2023-02-08 DIAGNOSIS — F4321 Adjustment disorder with depressed mood: Secondary | ICD-10-CM | POA: Diagnosis not present

## 2023-02-08 DIAGNOSIS — Z21 Asymptomatic human immunodeficiency virus [HIV] infection status: Secondary | ICD-10-CM

## 2023-02-08 DIAGNOSIS — F4320 Adjustment disorder, unspecified: Secondary | ICD-10-CM | POA: Insufficient documentation

## 2023-02-08 MED ORDER — DOVATO 50-300 MG PO TABS: 1.0000 | ORAL_TABLET | Freq: Every day | ORAL | 11 refills | Status: DC

## 2023-02-08 NOTE — Assessment & Plan Note (Signed)
 Very well controlled on once daily Dovato . No concerns with access or adherence to medication. They are tolerating the medication well without side effects. No drug interactions identified. Pertinent lab tests ordered today including viral load.  Ongoing adjustment reaction - discussed consideration of cabenuva injections if he wanted to consider a less frequent treatment. He will consider.  No changes to insurance coverage.  No dental needs today.  Sexual health and family planning discussed - not discussed today.  Vaccines discussed today - he will consider hepatitis B vaccines at next OV.    Return in about 3 months (around 05/09/2023).

## 2023-02-08 NOTE — Assessment & Plan Note (Addendum)
 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  I recommended he consider professional counseling to work through this to help him heal. He will consider resources we provided him today.

## 2023-02-08 NOTE — Progress Notes (Signed)
 Name: Calvin Martinez   DOB: 2000/08/21 MRN: [id number] PCP: Calvin Pries, NP    Brief Narrative:  Calvin Martinez  is a 23 y.o. male with HIV, Stage 2, diagnosed on 12-16-22. CD4 nadir 311 VL 297,000 Transmission Risk: sexual/remote msm History of OIs: none History of STIs: Hep B sAg (-), sAb (-), cAb (-); Hep A (-), Hep C (-) Quantiferon (-) Toxo IgG ( - ) CMV IgG ( + )  Previous Regimens: Dovato  12-2022  Genotypes: 12-2022 - wildtype  Subjective   Subjective:   Chief Complaint  Patient presents with   Follow-up    Calvin Martinez is here for HIV follow up. Started on Dovato  once daily in November for treatment. Initial VL 297,000 and CD4 was 311 indicating stage 2.   ER visit 11/23 for N/V - chronic relapsing problem for him since August. US  was negative for gallstone pathology.   Since then he has been doing better regarding GI side effects. He does have some trouble with adjustment to his health condition. We talked about this today as much as he was comfortable doing. He states it's always in the background and he tries to avoid it. Does talk with his mom a little who is supportive. Does feel like it is influencing day to day thoughts and is distracting him and interfering with executive functioning.  Sleeping OK - never sleeps great but this has not been worsened. Did take the hydroxyzine  for a period of time but did not indicate any benefit from it.       02/08/2023   12:49 PM  Depression screen PHQ 2/9  Decreased Interest 0  Down, Depressed, Hopeless 2  PHQ - 2 Score 2  Altered sleeping 2  Tired, decreased energy 2  Change in appetite 0  Feeling bad or failure about yourself  2  Trouble concentrating 2  Moving slowly or fidgety/restless 0  Suicidal thoughts 0  PHQ-9 Score 10  Difficult doing work/chores Somewhat difficult    Review of Systems  Constitutional:  Negative for chills, fever, malaise/fatigue and weight loss.  HENT:  Negative for  sore throat.        No dental problems  Respiratory:  Negative for cough and sputum production.   Cardiovascular:  Negative for chest pain and leg swelling.  Gastrointestinal:  Negative for abdominal pain, diarrhea and vomiting.  Genitourinary:  Negative for dysuria and flank pain.  Musculoskeletal:  Negative for joint pain, myalgias and neck pain.  Skin:  Negative for rash.  Neurological:  Negative for dizziness, tingling and headaches.  Psychiatric/Behavioral:  Positive for depression. Negative for substance abuse and suicidal ideas. The patient is not nervous/anxious and does not have insomnia.      Past Medical History:  Diagnosis Date   HIV-1 (human immunodeficiency virus I) (HCC) 12/22/2022    Outpatient Medications Prior to Visit  Medication Sig Dispense Refill   hydrOXYzine  (VISTARIL ) 50 MG capsule Take 1 capsule (50 mg total) by mouth at bedtime. 30 capsule 0   dolutegravir -lamiVUDine  (DOVATO ) 50-300 MG tablet Take 1 tablet by mouth daily. 30 tablet 5   No facility-administered medications prior to visit.     No Known Allergies  Social History   Tobacco Use   Smoking status: Never    Passive exposure: Never   Smokeless tobacco: Never  Vaping Use   Vaping status: Never Used  Substance Use Topics   Alcohol use: Never   Drug use: Never    Family History  Problem Relation  Age of Onset   Healthy Mother    Healthy Father     Social History   Substance and Sexual Activity  Sexual Activity Not Currently   Comment: accepted condoms       Objective   Objective:   Vitals:   02/08/23 1249  BP: 118/69  Pulse: 73  Temp: 98 F (36.7 C)  TempSrc: Temporal  SpO2: 96%  Weight: 162 lb (73.5 kg)  Height: 6' 1 (1.854 m)   Body mass index is 21.37 kg/m.  Physical Exam Vitals reviewed.  Constitutional:      Appearance: Normal appearance. He is not ill-appearing.  HENT:     Head: Normocephalic.     Mouth/Throat:     Mouth: Mucous membranes are moist.      Pharynx: Oropharynx is clear.  Eyes:     General: No scleral icterus. Cardiovascular:     Rate and Rhythm: Normal rate and regular rhythm.  Pulmonary:     Effort: Pulmonary effort is normal.  Musculoskeletal:        General: Normal range of motion.     Cervical back: Normal range of motion.  Skin:    Coloration: Skin is not jaundiced or pale.  Neurological:     Mental Status: He is alert and oriented to person, place, and time.  Psychiatric:        Mood and Affect: Mood normal.        Judgment: Judgment normal.        Assessment & Plan:   Problem List Items Addressed This Visit       Unprioritized   Adjustment reaction   Ongoing adjustment reaction with depressed features that are interfering with executive functions at work.  I recommended he consider professional counseling to work through this to help him heal. He will consider resources we provided him today.       HIV-1 (human immunodeficiency virus I) (HCC) - Primary   Very well controlled on once daily Dovato . No concerns with access or adherence to medication. They are tolerating the medication well without side effects. No drug interactions identified. Pertinent lab tests ordered today including viral load.  Ongoing adjustment reaction - discussed consideration of cabenuva injections if he wanted to consider a less frequent treatment. He will consider.  No changes to insurance coverage.  No dental needs today.  Sexual health and family planning discussed - not discussed today.  Vaccines discussed today - he will consider hepatitis B vaccines at next OV.    Return in about 3 months (around 05/09/2023).        Relevant Medications   dolutegravir -lamiVUDine  (DOVATO ) 50-300 MG tablet   Other Relevant Orders   HIV 1 RNA quant-no reflex-bld     Orders Placed This Encounter  Procedures   HIV 1 RNA quant-no reflex-bld    Meds ordered this encounter  Medications   dolutegravir -lamiVUDine  (DOVATO ) 50-300  MG tablet    Sig: Take 1 tablet by mouth daily.    Dispense:  30 tablet    Refill:  11    Prescription Type::   Renewal    Return in about 3 months (around 05/09/2023).   Calvin Fireman, MSN, NP-C [facility]  [email or web address] Pager: [phone] Office: [phone] [facility] Main Line: [phone] *Secure Chat Communication Welcome

## 2023-02-08 NOTE — Patient Instructions (Addendum)
 Nice to meet you  Please continue to take your Dovato  everyday.   Will see you again in 3 months.   Cabenuva is the name of the injections if you wanted to look into them.     Mental Health Resources  988: can call or text 24/7  [facility]: Address: [address], [place], [place] [address] Open 24 hours Phone: [phone]  [facility]: Address: [address], [place], [place] [address] Phone: [phone] Appointments: [email or web address]   Here are a list of behavioral health providers that accept Medicaid:   [facility]: [phone] [address], [place], [place] [address]  [facility]: [phone] [address] [place], [place] [address]  [facility]: [phone] [address], [place], [place] [address]  [facility] [place]: [phone] [facility] [address] [place], [place] [address]  [facility] [address] [place], [place] [address]

## 2023-02-10 LAB — HIV-1 RNA QUANT-NO REFLEX-BLD
HIV 1 RNA Quant: 31 {copies}/mL — ABNORMAL HIGH
HIV-1 RNA Quant, Log: 1.49 {Log_copies}/mL — ABNORMAL HIGH

## 2023-03-26 ENCOUNTER — Telehealth: Payer: Self-pay

## 2023-03-26 NOTE — Telephone Encounter (Signed)
Called [facility] to schedule follow up appointment, no answer. Left HIPAA compliant voicemail requesting callback.   Sandie Ano, RN

## 2023-05-18 ENCOUNTER — Ambulatory Visit: Admitting: Family

## 2023-06-19 ENCOUNTER — Emergency Department (HOSPITAL_COMMUNITY)

## 2023-06-19 ENCOUNTER — Other Ambulatory Visit: Payer: Self-pay

## 2023-06-19 ENCOUNTER — Encounter (HOSPITAL_COMMUNITY): Payer: Self-pay | Admitting: Emergency Medicine

## 2023-06-19 ENCOUNTER — Observation Stay (HOSPITAL_COMMUNITY)
Admission: EM | Admit: 2023-06-19 | Discharge: 2023-06-20 | Disposition: A | Discharge status: Home / Self Care | Attending: Student | Admitting: Student

## 2023-06-19 DIAGNOSIS — R17 Unspecified jaundice: Secondary | ICD-10-CM | POA: Insufficient documentation

## 2023-06-19 DIAGNOSIS — R7401 Elevation of levels of liver transaminase levels: Secondary | ICD-10-CM | POA: Diagnosis not present

## 2023-06-19 DIAGNOSIS — R109 Unspecified abdominal pain: Secondary | ICD-10-CM | POA: Diagnosis present

## 2023-06-19 DIAGNOSIS — R1084 Generalized abdominal pain: Principal | ICD-10-CM | POA: Insufficient documentation

## 2023-06-19 DIAGNOSIS — E876 Hypokalemia: Secondary | ICD-10-CM | POA: Insufficient documentation

## 2023-06-19 DIAGNOSIS — Z21 Asymptomatic human immunodeficiency virus [HIV] infection status: Secondary | ICD-10-CM | POA: Diagnosis not present

## 2023-06-19 DIAGNOSIS — R112 Nausea with vomiting, unspecified: Principal | ICD-10-CM | POA: Insufficient documentation

## 2023-06-19 LAB — URINALYSIS, ROUTINE W REFLEX MICROSCOPIC
Bacteria, UA: NONE SEEN
Bilirubin Urine: NEGATIVE
Glucose, UA: NEGATIVE mg/dL
Hgb urine dipstick: NEGATIVE
Ketones, ur: NEGATIVE mg/dL
Leukocytes,Ua: NEGATIVE
Nitrite: NEGATIVE
Protein, ur: NEGATIVE mg/dL
Specific Gravity, Urine: >1.046 — ABNORMAL HIGH (ref 1.005–1.030)
pH: 8 (ref 5.0–8.0)

## 2023-06-19 LAB — CBC
HCT: 41.3 % (ref 39.0–52.0)
Hemoglobin: 13.7 g/dL (ref 13.0–17.0)
MCH: 27.8 pg (ref 26.0–34.0)
MCHC: 33.2 g/dL (ref 30.0–36.0)
MCV: 83.9 fL (ref 80.0–100.0)
Platelets: 254 10*3/uL (ref 150–400)
RBC: 4.92 MIL/uL (ref 4.22–5.81)
RDW: 13.8 % (ref 11.5–15.5)
WBC: 7.4 10*3/uL (ref 4.0–10.5)
nRBC: 0 % (ref 0.0–0.2)

## 2023-06-19 LAB — RAPID URINE DRUG SCREEN, HOSP PERFORMED
Amphetamines: NOT DETECTED
Barbiturates: NOT DETECTED
Benzodiazepines: NOT DETECTED
Cocaine: NOT DETECTED
Opiates: NOT DETECTED
Tetrahydrocannabinol: NOT DETECTED

## 2023-06-19 LAB — COMPREHENSIVE METABOLIC PANEL WITH GFR
ALT: 19 U/L (ref 0–44)
AST: 44 U/L — ABNORMAL HIGH (ref 15–41)
Albumin: 4.4 g/dL (ref 3.5–5.0)
Alkaline Phosphatase: 63 U/L (ref 38–126)
Anion gap: 17 — ABNORMAL HIGH (ref 5–15)
BUN: 18 mg/dL (ref 6–20)
CO2: 19 mmol/L — ABNORMAL LOW (ref 22–32)
Calcium: 9.8 mg/dL (ref 8.9–10.3)
Chloride: 104 mmol/L (ref 98–111)
Creatinine, Ser: 1.06 mg/dL (ref 0.61–1.24)
GFR, Estimated: >60 mL/min (ref >60)
Glucose, Bld: 155 mg/dL — ABNORMAL HIGH (ref 70–99)
Potassium: 2.8 mmol/L — ABNORMAL LOW (ref 3.5–5.1)
Sodium: 140 mmol/L (ref 135–145)
Total Bilirubin: 1.9 mg/dL — ABNORMAL HIGH (ref 0.0–1.2)
Total Protein: 7.6 g/dL (ref 6.5–8.1)

## 2023-06-19 LAB — LIPASE, BLOOD: Lipase: 28 U/L (ref 11–51)

## 2023-06-19 MED ORDER — ONDANSETRON HCL 4 MG/2ML IJ SOLN
4.0000 mg | Freq: Once | INTRAMUSCULAR | Status: AC
  Administered 2023-06-19: 4 mg via INTRAVENOUS
  Filled 2023-06-19: qty 2

## 2023-06-19 MED ORDER — POTASSIUM CHLORIDE 10 MEQ/100ML IV SOLN: 10.0000 meq | Freq: Once | INTRAVENOUS | Status: DC

## 2023-06-19 MED ORDER — SODIUM CHLORIDE 0.9 % IV BOLUS
1000.0000 mL | Freq: Once | INTRAVENOUS | Status: AC
  Administered 2023-06-19: 1000 mL via INTRAVENOUS

## 2023-06-19 MED ORDER — POTASSIUM CHLORIDE CRYS ER 20 MEQ PO TBCR: 40.0000 meq | EXTENDED_RELEASE_TABLET | Freq: Once | ORAL | Status: DC

## 2023-06-19 MED ORDER — POTASSIUM CHLORIDE 10 MEQ/100ML IV SOLN
10.0000 meq | INTRAVENOUS | Status: AC
  Administered 2023-06-19 (×3): 10 meq via INTRAVENOUS
  Filled 2023-06-19 (×3): qty 100

## 2023-06-19 MED ORDER — METOCLOPRAMIDE HCL 5 MG/ML IJ SOLN
10.0000 mg | Freq: Once | INTRAMUSCULAR | Status: AC
  Administered 2023-06-19: 10 mg via INTRAVENOUS
  Filled 2023-06-19: qty 2

## 2023-06-19 MED ORDER — LACTATED RINGERS IV BOLUS
1000.0000 mL | Freq: Once | INTRAVENOUS | Status: AC
  Administered 2023-06-19: 1000 mL via INTRAVENOUS

## 2023-06-19 MED ORDER — SODIUM CHLORIDE 0.9 % IV SOLN
12.5000 mg | Freq: Four times a day (QID) | INTRAVENOUS | Status: DC | PRN
  Administered 2023-06-19: 12.5 mg via INTRAVENOUS
  Filled 2023-06-19: qty 12.5

## 2023-06-19 MED ORDER — DIPHENHYDRAMINE HCL 50 MG/ML IJ SOLN
25.0000 mg | Freq: Once | INTRAMUSCULAR | Status: AC
  Administered 2023-06-19: 25 mg via INTRAVENOUS
  Filled 2023-06-19: qty 1

## 2023-06-19 MED ORDER — KETOROLAC TROMETHAMINE 30 MG/ML IJ SOLN
30.0000 mg | Freq: Once | INTRAMUSCULAR | Status: AC
  Administered 2023-06-19: 30 mg via INTRAVENOUS
  Filled 2023-06-19: qty 1

## 2023-06-19 MED ORDER — IOHEXOL 350 MG/ML SOLN
75.0000 mL | Freq: Once | INTRAVENOUS | Status: AC | PRN
  Administered 2023-06-19: 75 mL via INTRAVENOUS

## 2023-06-19 MED ORDER — FAMOTIDINE IN NACL 20-0.9 MG/50ML-% IV SOLN
20.0000 mg | Freq: Once | INTRAVENOUS | Status: AC
  Administered 2023-06-19: 20 mg via INTRAVENOUS
  Filled 2023-06-19: qty 50

## 2023-06-19 NOTE — ED Provider Notes (Signed)
 [place] EMERGENC[facility] DEPARTMENT AT [facility] Provider Note  History  Chief Complaint:  Abdominal Pain and Emesis  The history is provided by the patient.  Abdominal Pain Pain location:  Generalized Pain quality: cramping   Duration:  4 hours Timing:  Constant Progression:  Unchanged Chronicity:  New Ineffective treatments:  None tried Associated symptoms: chills, nausea and vomiting   Associated symptoms: no chest pain and no fever   Emesis Associated symptoms: abdominal pain and chills   Associated symptoms: no fever      Calvin Martinez  is a 23 y.o. male with a history of HIV who presents the emergency department for abdominal pain and emesis.  Past Medical History:  Diagnosis Date   HIV-1 (human immunodeficiency virus I) (HCC) 12/22/2022    History reviewed. No pertinent surgical history.  Family History  Problem Relation Age of Onset   Healthy Mother    Healthy Father     Social History   Tobacco Use   Smoking status: Never    Passive exposure: Never   Smokeless tobacco: Never  Vaping Use   Vaping status: Never Used  Substance Use Topics   Alcohol use: Never   Drug use: Never    Review of Systems  Review of Systems  Constitutional:  Positive for chills. Negative for fever.  Cardiovascular:  Negative for chest pain.  Gastrointestinal:  Positive for abdominal pain, nausea and vomiting.     Reviewed and documented in HPI if pertinent.   Physical Exam   ED Triage Vitals  Encounter Vitals Group     BP 06/19/23 1907 129/77     Systolic BP Percentile --      Diastolic BP Percentile --      Pulse Rate 06/19/23 1907 87     Resp 06/19/23 1907 (!) 28     Temp 06/19/23 1907 98.7 F (37.1 C)     Temp src --      SpO2 06/19/23 1907 100 %     Weight 06/19/23 1903 162 lb 0.6 oz (73.5 kg)     Height 06/19/23 1903 6\' 1"  (1.854 m)     Head Circumference --      Peak Flow --      Pain Score 06/19/23 1903 10     Pain Loc --      Pain  Education --      Exclude from Growth Chart --      Physical Exam Vitals and nursing note reviewed.  Constitutional:      Appearance: He is well-developed. He is ill-appearing. He is not toxic-appearing.  HENT:     Head: Normocephalic and atraumatic.  Eyes:     Conjunctiva/sclera: Conjunctivae normal.  Cardiovascular:     Rate and Rhythm: Normal rate and regular rhythm.     Heart sounds: No murmur heard. Pulmonary:     Effort: Pulmonary effort is normal. No respiratory distress.     Breath sounds: Normal breath sounds.  Abdominal:     Palpations: Abdomen is soft.     Tenderness: There is generalized abdominal tenderness.  Musculoskeletal:        General: No swelling.     Cervical back: Neck supple.  Skin:    General: Skin is warm and dry.     Capillary Refill: Capillary refill takes less than 2 seconds.  Neurological:     Mental Status: He is alert.  Psychiatric:        Mood and Affect: Mood normal.  Procedures   Procedures  ED Course - Medical Decision Making  Brief Overview [name]  is a 23 y.o. male who presents as per above.  I have reviewed the nursing documentation for past medical history, family history, and social history and agree.  I have reviewed the patient's vital signs. There are no abnormalities.  Initial Differential Diagnoses: I am primarily concerned for colitis, gastroenteritis, electrolyte abnormalities, THC hyperemesis, medication side effects.  Therapies: These medications and interventions were provided for the patient while in the ED.  Medications  potassium chloride SA (KLOR-CON M) CR tablet 40 mEq (0 mEq Oral Hold 06/19/23 2221)  promethazine (PHENERGAN) 12.5 mg in sodium chloride  0.9 % 50 mL IVPB (12.5 mg Intravenous New Bag/Given 06/19/23 2348)  lactated ringers  bolus 1,000 mL (0 mLs Intravenous Stopped 06/19/23 2305)  ondansetron  (ZOFRAN ) injection 4 mg (4 mg Intravenous Given 06/19/23 2016)  potassium chloride 10 mEq  in 100 mL IVPB (0 mEq Intravenous Stopped 06/19/23 2346)  metoCLOPramide  (REGLAN ) injection 10 mg (10 mg Intravenous Given 06/19/23 2030)  diphenhydrAMINE  (BENADRYL ) injection 25 mg (25 mg Intravenous Given 06/19/23 2033)  iohexol (OMNIPAQUE) 350 MG/ML injection 75 mL (75 mLs Intravenous Contrast Given 06/19/23 2046)  ketorolac (TORADOL) 30 MG/ML injection 30 mg (30 mg Intravenous Given 06/19/23 2305)  famotidine  (PEPCID ) IVPB 20 mg premix (0 mg Intravenous Stopped 06/19/23 2305)  sodium chloride  0.9 % bolus 1,000 mL (1,000 mLs Intravenous New Bag/Given 06/19/23 2305)    Testing Results: On my interpretation labs are significant for : Bicarb 19 K 2.8 UA without infection Lipase 28  On my interpretation imaging is significant for: CT abdomen and pelvis with gastritis   See the EMR for full details regarding lab and imaging results.   Medical Decision Making 23 year old male history of HIV who presents the emergency department for generalized abdominal pain and nausea/vomiting.  He is currently dry heaving on my initial assessment.  He is having tachycardia however I suspect this is due to his vomiting state.  He is afebrile.  His exam reveals a generalized mildly tender abdomen.  States this happened today.  No new medications or suspicious foods.  No trauma.  No fevers.  Laboratory data suggest mild acidosis with hypokalemia.  This is likely in the setting of dehydration and vomiting.  We replete these.  Patient was given fluids as well as multiple pain medications and antinausea medications.  Patient continues to have dry heaving on exam.  CT abdomen pelvis reveals signs of gastritis however no other acute abnormalities.  At this time we are awaiting symptom resolution and a p.o. challenge.  This was communicated to the oncoming team and they will dispo accordingly.  Problems Addressed: Generalized abdominal pain: acute illness or injury that poses a threat to life or bodily  functions  Amount and/or Complexity of Data Reviewed Labs: ordered. Radiology: ordered.  Risk Prescription drug management.     ### All radiography studies, electrocardiograms, and laboratory data were personally reviewed by me and incorporated into my medical decision making. Impression   1. Generalized abdominal pain   2. Nausea and vomiting, unspecified vomiting type      Note: Dragon medical dictation software was used in the creation of this note.     Arminda Landmark, MD 06/20/23 Ernest Head    Dalene Duck, MD 06/20/23 [phone]

## 2023-06-19 NOTE — ED Triage Notes (Signed)
 Pt endorses generalized abd pain, n/v/d for one hour. Hx of HIV. Actively vomiting in triage.

## 2023-06-19 NOTE — ED Notes (Signed)
 Mother Bertell Broach [phone] would like an update asap

## 2023-06-19 NOTE — ED Provider Notes (Signed)
  Physical Exam  BP 114/60 (BP Location: Left Arm)   Pulse 70   Temp 98.6 F (37 C) (Oral)   Resp 14   Ht 6\' 1"  (1.854 m)   Wt 73.5 kg   SpO2 91%   BMI 21.38 kg/m   Physical Exam  Procedures  Procedures  ED Course / MDM   Clinical Course as of 06/20/23 0204  Sun Jun 20, 2023  0143 Patient with repeat vomiting while I was at the bedside, NBNB emesis. Shared decision making with the patient regarding disposition; preference for admission at this time. Pending hospitalist consult.  [RS]  0155 Consulted Dr. Lydia Sams, hospitalist was agreeable to admit this patient to her service.  Appreciate her collaboration in the care of this patient. [RS]  0201 Per patient request, his mother was updated on his condition and disposition plan via phone.  [RS]    Clinical Course User Index [RS] Sahasra Belue, Adelle Agent, PA-C   Medical Decision Making Amount and/or Complexity of Data Reviewed Labs: ordered. Radiology: ordered.  Risk Prescription drug management. Decision regarding hospitalization.    Care of this patient assumed from preceding ED provider Dr. Emory Harps. Please see his  associated note for further insight into the patient's ED course. In brief, patient with history of HIV following with infectious disease who presents with abdominal pain, N, V with NBNB emesis. Has failed PO challenge after multiple rounds of antiemetics. At time of shift change pending potassium repletion, PO challenge. If continues to struggle with PO, will plan to admit for intractable nausea and vomiting.   Patient continuing to vomit despite multiple rounds of antiemetic.  No abdominal pain at this time.  Feeling p.o. challenge.  Her decision making with the patient as above, plan for admission at this time.  Consult Dr. Lydia Sams of [facility] hospitalist who is agreeable to admit this patient to her service.  Judy voiced understanding of her medical evaluation and treatment plan. Each of their questions answered to  their expressed satisfaction. He is amenable to plan for admission at this time.   This chart was dictated using voice recognition software, Dragon. Despite the best efforts of this provider to proofread and correct errors, errors may still occur which can change documentation meaning.      Anajah Sterbenz R, PA-C 06/20/23 5643    Eve Hinders, MD 06/20/23 [phone]

## 2023-06-20 DIAGNOSIS — R112 Nausea with vomiting, unspecified: Secondary | ICD-10-CM

## 2023-06-20 DIAGNOSIS — R1114 Bilious vomiting: Secondary | ICD-10-CM | POA: Diagnosis not present

## 2023-06-20 LAB — MAGNESIUM: Magnesium: 1.6 mg/dL — ABNORMAL LOW (ref 1.7–2.4)

## 2023-06-20 LAB — LIPID PANEL
Cholesterol: 119 mg/dL (ref 0–200)
HDL: 35 mg/dL — ABNORMAL LOW (ref >40)
LDL Cholesterol: 72 mg/dL (ref 0–99)
Total CHOL/HDL Ratio: 3.4 ratio
Triglycerides: 59 mg/dL (ref <150)
VLDL: 12 mg/dL (ref 0–40)

## 2023-06-20 LAB — COMPREHENSIVE METABOLIC PANEL WITH GFR
ALT: 15 U/L (ref 0–44)
AST: 32 U/L (ref 15–41)
Albumin: 3.8 g/dL (ref 3.5–5.0)
Alkaline Phosphatase: 46 U/L (ref 38–126)
Anion gap: 8 (ref 5–15)
BUN: 15 mg/dL (ref 6–20)
CO2: 24 mmol/L (ref 22–32)
Calcium: 8.5 mg/dL — ABNORMAL LOW (ref 8.9–10.3)
Chloride: 105 mmol/L (ref 98–111)
Creatinine, Ser: 0.87 mg/dL (ref 0.61–1.24)
GFR, Estimated: >60 mL/min (ref >60)
Glucose, Bld: 118 mg/dL — ABNORMAL HIGH (ref 70–99)
Potassium: 3.6 mmol/L (ref 3.5–5.1)
Sodium: 137 mmol/L (ref 135–145)
Total Bilirubin: 1.7 mg/dL — ABNORMAL HIGH (ref 0.0–1.2)
Total Protein: 6.4 g/dL — ABNORMAL LOW (ref 6.5–8.1)

## 2023-06-20 LAB — I-STAT CG4 LACTIC ACID, ED: Lactic Acid, Venous: 1.3 mmol/L (ref 0.5–1.9)

## 2023-06-20 LAB — TSH: TSH: 0.627 u[IU]/mL (ref 0.350–4.500)

## 2023-06-20 MED ORDER — ONDANSETRON HCL 4 MG PO TABS: 4.0000 mg | ORAL_TABLET | Freq: Four times a day (QID) | ORAL | 0 refills | Status: AC | PRN

## 2023-06-20 MED ORDER — POTASSIUM CHLORIDE IN NACL 20-0.9 MEQ/L-% IV SOLN
INTRAVENOUS | Status: DC
Start: 2023-06-20 — End: 2023-06-20
  Filled 2023-06-20: qty 1000

## 2023-06-20 MED ORDER — MAGNESIUM SULFATE 2 GM/50ML IV SOLN
2.0000 g | Freq: Once | INTRAVENOUS | Status: AC
  Administered 2023-06-20: 2 g via INTRAVENOUS
  Filled 2023-06-20: qty 50

## 2023-06-20 MED ORDER — SODIUM CHLORIDE 0.9% FLUSH
3.0000 mL | Freq: Two times a day (BID) | INTRAVENOUS | Status: DC
  Administered 2023-06-20: 3 mL via INTRAVENOUS

## 2023-06-20 MED ORDER — PANTOPRAZOLE SODIUM 40 MG IV SOLR
40.0000 mg | Freq: Two times a day (BID) | INTRAVENOUS | Status: DC
  Administered 2023-06-20: 40 mg via INTRAVENOUS
  Filled 2023-06-20: qty 10

## 2023-06-20 MED ORDER — DOLUTEGRAVIR-LAMIVUDINE 50-300 MG PO TABS
1.0000 | ORAL_TABLET | Freq: Every day | ORAL | Status: DC
  Filled 2023-06-20: qty 1

## 2023-06-20 MED ORDER — DROPERIDOL 2.5 MG/ML IJ SOLN
2.5000 mg | Freq: Once | INTRAMUSCULAR | Status: DC
Start: 2023-06-20 — End: 2023-06-20

## 2023-06-20 MED ORDER — ACETAMINOPHEN 325 MG PO TABS: 650.0000 mg | ORAL_TABLET | Freq: Four times a day (QID) | ORAL | Status: DC | PRN

## 2023-06-20 MED ORDER — SODIUM CHLORIDE 0.9 % IV SOLN
INTRAVENOUS | Status: DC
  Administered 2023-06-20: 100 mL/h via INTRAVENOUS

## 2023-06-20 MED ORDER — ACETAMINOPHEN 650 MG RE SUPP: 650.0000 mg | Freq: Four times a day (QID) | RECTAL | Status: DC | PRN

## 2023-06-20 MED ORDER — PANTOPRAZOLE SODIUM 40 MG PO TBEC: 40.0000 mg | DELAYED_RELEASE_TABLET | Freq: Every day | ORAL | 0 refills | Status: DC

## 2023-06-20 NOTE — Discharge Summary (Signed)
 Physician Discharge Summary  Calvin Martinez  WUJ:[id number] DOB: 06-Jun-2000 DOA: 06/19/2023  PCP: Pcp, No  Admit date: 06/19/2023 Discharge date: 06/20/23  Admitted From: Home Disposition: Home Recommendations for Outpatient Follow-up:  Follow up with PCP in in 1 to 2 weeks Please follow up on the following pending results: None  Home Health: No need identified Equipment/Devices: No need identified  Discharge Condition: Stable CODE STATUS: Full code   Hospital course 23 year old M with PMH of HIV on HAART, abdominal pain presenting with acute nausea, vomiting and abdominal pain, and admitted with intractable nausea and vomiting, hypokalemia, anion gap metabolic acidosis and mild hyperglycemia.  AST slightly elevated to 44.  T. bili 1.9.  No fever or leukocytosis.  CT abdomen and pelvis suggested gastritis.  UA with slightly increased specific gravity.  UDS negative.  Patient was started on IV fluid, Protonix  and potassium replacement, and admitted for observation.  Later in the morning, patient's symptoms and hypokalemia resolved.  AST normalized.  Total bili improved to 1.7.  Lactic acid within normal.  He tolerated soft diet and felt well and ready to go home.  He is discharged on p.o. Protonix .  Also issued prescription for Zofran  as needed.  Advised to continue soft diet over the next couple of days.  See individual problem list below for more.   Problems addressed during this hospitalization Intractable nausea vomiting with abdominal pain: CT abdomen and pelvis concerning for gastritis.  No NSAID use.  Symptoms resolved.  Abdominal exam benign.  Tolerated soft diet. -P.o. Protonix  40 mg daily -Zofran  as needed  HIV: Reports compliance with meds. -Continue home meds   Hypokalemia: Resolved  Hypomagnesemia: Replenished prior to discharge  Elevated AST: Resolved  Hyperbilirubinemia: Mild.  Resolved              Time spent 35 minutes  Vital signs Vitals:    06/20/23 0442 06/20/23 0445 06/20/23 0500 06/20/23 0515  BP:  (!) 118/55 (!) 124/58 121/70  Pulse:  (!) 58 (!) 58 77  Temp: 98 F (36.7 C)     Resp:  12 18 19   Height:      Weight:      SpO2:  100% 98% 98%  TempSrc: Oral     BMI (Calculated):         Discharge exam  GENERAL: No apparent distress.  Nontoxic. HEENT: MMM.  Vision and hearing grossly intact.  NECK: Supple.  No apparent JVD.  RESP:  No IWOB.  Fair aeration bilaterally. CVS:  RRR. Heart sounds normal.  ABD/GI/GU: BS+. Abd soft, NTND.  MSK/EXT:  Moves extremities. No apparent deformity. No edema.  SKIN: no apparent skin lesion or wound NEURO: Awake and alert. Oriented appropriately.  No apparent focal neuro deficit. PSYCH: Calm. Normal affect.   Discharge Instructions Discharge Instructions     Diet general   Complete by: As directed    Soft diet for the next couple of days   Discharge instructions   Complete by: As directed    It has been a pleasure taking care of you!  You were hospitalized due to nausea, vomiting and abdominal pain.  You CT scan showed concern for gastritis (some irritation and inflammation in your stomach).  Have started you on pantoprazole  (acid reflux medication) for this.  Continue soft diet over the next couple of days, and advance your diet to regular if no more symptoms.  Avoid any over-the-counter pain medication except plain Tylenol .  Maintain good hydration..   Take  care,   Increase activity slowly   Complete by: As directed       Allergies as of 06/20/2023   No Known Allergies      Medication List     TAKE these medications    Dovato  50-300 MG tablet Generic drug: dolutegravir-lamiVUDine Take 1 tablet by mouth daily.   ondansetron  4 MG tablet Commonly known as: Zofran  Take 1 tablet (4 mg total) by mouth every 6 (six) hours as needed for up to 5 days for nausea or vomiting.   pantoprazole  40 MG tablet Commonly known as: Protonix  Take 1 tablet (40 mg total) by  mouth daily.        Consultations: None  Procedures/Studies:   CT ABDOMEN PELVIS W CONTRAST Result Date: 06/19/2023 CLINICAL DATA:  Abdominal pain, acute, nonlocalized EXAM: CT ABDOMEN AND PELVIS WITH CONTRAST TECHNIQUE: Multidetector CT imaging of the abdomen and pelvis was performed using the standard protocol following bolus administration of intravenous contrast. RADIATION DOSE REDUCTION: This exam was performed according to the departmental dose-optimization program which includes automated exposure control, adjustment of the mA and/or kV according to patient size and/or use of iterative reconstruction technique. CONTRAST:  75mL OMNIPAQUE IOHEXOL 350 MG/ML SOLN COMPARISON:  None Available. FINDINGS: Lower chest: No acute abnormality. Hepatobiliary: No focal liver abnormality is seen. No gallstones, gallbladder wall thickening, or biliary dilatation. Pancreas: Unremarkable. No pancreatic ductal dilatation or surrounding inflammatory changes. Spleen: Normal in size without focal abnormality. Adrenals/Urinary Tract: Adrenal glands are unremarkable. Kidneys are normal, without definitive renal calculi, focal lesion, or hydronephrosis. Bladder is unremarkable. Stomach/Bowel: No evidence of bowel obstruction. Appendix is normal. There is gastric fold and wall thickening. Colon is decompressed. Vascular/Lymphatic: No significant vascular findings are present. No enlarged abdominal or pelvic lymph nodes. Reproductive: Prostate is unremarkable. Other: No abdominal wall hernia or abnormality. No abdominopelvic ascites. Musculoskeletal: No acute or significant osseous findings. IMPRESSION: There is gastric fold and wall thickening. This is nonspecific and could reflect gastritis. Recommend correlation with symptoms. Electronically Signed   By: Clancy Crimes M.D.   On: 06/19/2023 20:56       The results of significant diagnostics from this hospitalization (including imaging, microbiology, ancillary  and laboratory) are listed below for reference.     Microbiology: No results found for this or any previous visit (from the past 240 hours).   Labs:  CBC: Recent Labs  Lab 06/19/23 1914  WBC 7.4  HGB 13.7  HCT 41.3  MCV 83.9  PLT 254   BMP &GFR Recent Labs  Lab 06/19/23 1914 06/20/23 0145  NA 140 137  K 2.8* 3.6  CL 104 105  CO2 19* 24  GLUCOSE 155* 118*  BUN 18 15  CREATININE 1.06 0.87  CALCIUM 9.8 8.5*  MG  --  1.6*   Estimated Creatinine Clearance: 137.3 mL/min (by C-G formula based on SCr of 0.87 mg/dL). Liver & Pancreas: Recent Labs  Lab 06/19/23 1914 06/20/23 0145  AST 44* 32  ALT 19 15  ALKPHOS 63 46  BILITOT 1.9* 1.7*  PROT 7.6 6.4*  ALBUMIN 4.4 3.8   Recent Labs  Lab 06/19/23 1914  LIPASE 28   No results for input(s): "AMMONIA" in the last 168 hours. Diabetic: No results for input(s): "HGBA1C" in the last 72 hours. No results for input(s): "GLUCAP" in the last 168 hours. Cardiac Enzymes: No results for input(s): "CKTOTAL", "CKMB", "CKMBINDEX", "TROPONINI" in the last 168 hours. No results for input(s): "PROBNP" in the last 8760 hours. Coagulation Profile:  No results for input(s): "INR", "PROTIME" in the last 168 hours. Thyroid Function Tests: No results for input(s): "TSH", "T4TOTAL", "FREET4", "T3FREE", "THYROIDAB" in the last 72 hours. Lipid Profile: Recent Labs    06/20/23 0145  CHOL 119  HDL 35*  LDLCALC 72  TRIG 59  CHOLHDL 3.4   Anemia Panel: No results for input(s): "VITAMINB12", "FOLATE", "FERRITIN", "TIBC", "IRON", "RETICCTPCT" in the last 72 hours. Urine analysis:    Component Value Date/Time   COLORURINE YELLOW 06/19/2023 2000   APPEARANCEUR HAZY (A) 06/19/2023 2000   LABSPEC >1.046 (H) 06/19/2023 2000   PHURINE 8.0 06/19/2023 2000   GLUCOSEU NEGATIVE 06/19/2023 2000   HGBUR NEGATIVE 06/19/2023 2000   BILIRUBINUR NEGATIVE 06/19/2023 2000   BILIRUBINUR small (A) 09/17/2022 1802   KETONESUR NEGATIVE 06/19/2023 2000    PROTEINUR NEGATIVE 06/19/2023 2000   UROBILINOGEN 1.0 09/17/2022 1802   NITRITE NEGATIVE 06/19/2023 2000   LEUKOCYTESUR NEGATIVE 06/19/2023 2000   Sepsis Labs: Invalid input(s): "PROCALCITONIN", "LACTICIDVEN"   SIGNED:  Theadore Finger, MD  [facility] 06/20/2023, 10:29 AM

## 2023-06-20 NOTE — ED Notes (Signed)
 [place] 1.3

## 2023-06-20 NOTE — H&P (Signed)
 History and Physical    Patient: Calvin Martinez  WUJ:[id number] DOB: 03-15-2000 DOA: 06/19/2023 DOS: the patient was seen and examined on 06/20/2023 PCP: Pcp, No  Patient coming from: Home Chief complaint: Chief Complaint  Patient presents with   Abdominal Pain   Emesis   HPI:  Calvin Martinez  is a 23 y.o. male with past medical history  of HIV, generalized Abdominal pain and nausea vomiting presenting with same complaints.  No retching no chest pain fevers chills diarrhea bleeding dizziness or any other complaint. No reports of blood in vomitus.  ED Course: Pt in ed at bedside  is alert awake oriented afebrile blood pressures low normal. Vital signs in the ED were notable for the following:  Vitals:   06/20/23 0442 06/20/23 0445 06/20/23 0500 06/20/23 0515  BP:  (!) 118/55 (!) 124/58 121/70  Pulse:  (!) 58 (!) 58 77  Temp: 98 F (36.7 C)     Resp:  12 18 19   Height:      Weight:      SpO2:  100% 98% 98%  TempSrc: Oral     BMI (Calculated):       >>ED evaluation thus far shows: Normal CBC. Normal lactic acid. CMP showing hypokalemia of 2.8 glucose 155 bicarb 19 anion gap of 17, AST of 44 total bili of 1.9. EKG today sinus rhythm at 71 PR 187, nonspecific T wave changes and anterior leads. >>While in the ED patient received the following: Medications  potassium chloride SA (KLOR-CON M) CR tablet 40 mEq (0 mEq Oral Hold 06/19/23 2221)  promethazine (PHENERGAN) 12.5 mg in sodium chloride  0.9 % 50 mL IVPB (0 mg Intravenous Stopped 06/20/23 0008)  droperidol  (INAPSINE ) 2.5 MG/ML injection 2.5 mg (has no administration in time range)  lactated ringers  bolus 1,000 mL (0 mLs Intravenous Stopped 06/19/23 2305)  ondansetron  (ZOFRAN ) injection 4 mg (4 mg Intravenous Given 06/19/23 2016)  potassium chloride 10 mEq in 100 mL IVPB (0 mEq Intravenous Stopped 06/19/23 2346)  metoCLOPramide  (REGLAN ) injection 10 mg (10 mg Intravenous Given 06/19/23 2030)  diphenhydrAMINE  (BENADRYL )  injection 25 mg (25 mg Intravenous Given 06/19/23 2033)  iohexol (OMNIPAQUE) 350 MG/ML injection 75 mL (75 mLs Intravenous Contrast Given 06/19/23 2046)  ketorolac (TORADOL) 30 MG/ML injection 30 mg (30 mg Intravenous Given 06/19/23 2305)  famotidine  (PEPCID ) IVPB 20 mg premix (0 mg Intravenous Stopped 06/19/23 2305)  sodium chloride  0.9 % bolus 1,000 mL (1,000 mLs Intravenous New Bag/Given 06/19/23 2305)   Review of Systems  Constitutional:  Positive for chills.  Gastrointestinal:  Positive for abdominal pain, diarrhea, nausea and vomiting.  All other systems reviewed and are negative.  Past Medical History:  Diagnosis Date   HIV-1 (human immunodeficiency virus I) (HCC) 12/22/2022   History reviewed. No pertinent surgical history.  reports that he has never smoked. He has never been exposed to tobacco smoke. He has never used smokeless tobacco. He reports that he does not drink alcohol and does not use drugs. No Known Allergies Family History  Problem Relation Age of Onset   Healthy Mother    Healthy Father    Prior to Admission medications   Medication Sig Start Date End Date Taking? Authorizing Provider  dolutegravir-lamiVUDine (DOVATO ) 50-300 MG tablet Take 1 tablet by mouth daily. 02/08/23   Orson Blalock, NP  hydrOXYzine  (VISTARIL ) 50 MG capsule Take 1 capsule (50 mg total) by mouth at bedtime. 12/22/22   Jamesetta Mcbride, PA-C  Vitals:   06/20/23 0442 06/20/23 0445 06/20/23 0500 06/20/23 0515  BP:  (!) 118/55 (!) 124/58 121/70  Pulse:  (!) 58 (!) 58 77  Resp:  12 18 19   Temp: 98 F (36.7 C)     TempSrc: Oral     SpO2:  100% 98% 98%  Weight:      Height:       Physical Exam Vitals and nursing note reviewed.  Constitutional:      General: He is not in acute distress. HENT:     Head: Normocephalic and atraumatic.     Right Ear: Hearing normal.     Left Ear: Hearing normal.     Nose: No  nasal deformity.     Mouth/Throat:     Lips: Pink.  Eyes:     General: Lids are normal.     Extraocular Movements: Extraocular movements intact.  Cardiovascular:     Rate and Rhythm: Normal rate and regular rhythm.     Heart sounds: Normal heart sounds.  Pulmonary:     Effort: Pulmonary effort is normal.     Breath sounds: Normal breath sounds.  Abdominal:     General: Bowel sounds are normal. There is no distension.     Palpations: Abdomen is soft. There is no mass.     Tenderness: There is no abdominal tenderness.  Musculoskeletal:     Right lower leg: No edema.     Left lower leg: No edema.  Skin:    General: Skin is warm.  Neurological:     General: No focal deficit present.     Mental Status: He is alert and oriented to person, place, and time.     Cranial Nerves: Cranial nerves 2-12 are intact.  Psychiatric:        Speech: Speech normal.     Labs on Admission: I have personally reviewed following labs and imaging studies CBC: Recent Labs  Lab 06/19/23 1914  WBC 7.4  HGB 13.7  HCT 41.3  MCV 83.9  PLT 254   Basic Metabolic Panel: Recent Labs  Lab 06/19/23 1914 06/20/23 0145  NA 140 137  K 2.8* 3.6  CL 104 105  CO2 19* 24  GLUCOSE 155* 118*  BUN 18 15  CREATININE 1.06 0.87  CALCIUM 9.8 8.5*  MG  --  1.6*   GFR: Estimated Creatinine Clearance: 137.3 mL/min (by C-G formula based on SCr of 0.87 mg/dL). Liver Function Tests: Recent Labs  Lab 06/19/23 1914 06/20/23 0145  AST 44* 32  ALT 19 15  ALKPHOS 63 46  BILITOT 1.9* 1.7*  PROT 7.6 6.4*  ALBUMIN 4.4 3.8   Recent Labs  Lab 06/19/23 1914  LIPASE 28   No results for input(s): "AMMONIA" in the last 168 hours. Coagulation Profile: No results for input(s): "INR", "PROTIME" in the last 168 hours. Cardiac Enzymes: No results for input(s): "CKTOTAL", "CKMB", "CKMBINDEX", "TROPONINI" in the last 168 hours. BNP (last 3 results) No results for input(s): "PROBNP" in the last 8760 hours. HbA1C: No  results for input(s): "HGBA1C" in the last 72 hours. CBG: No results for input(s): "GLUCAP" in the last 168 hours. Lipid Profile: No results for input(s): "CHOL", "HDL", "LDLCALC", "TRIG", "CHOLHDL", "LDLDIRECT" in the last 72 hours. Thyroid Function Tests: No results for input(s): "TSH", "T4TOTAL", "FREET4", "T3FREE", "THYROIDAB" in the last 72 hours. Anemia Panel: No results for input(s): "VITAMINB12", "FOLATE", "FERRITIN", "TIBC", "IRON", "RETICCTPCT" in the last 72 hours. Urine analysis:    Component Value Date/Time  COLORURINE YELLOW 06/19/2023 2000   APPEARANCEUR HAZY (A) 06/19/2023 2000   LABSPEC >1.046 (H) 06/19/2023 2000   PHURINE 8.0 06/19/2023 2000   GLUCOSEU NEGATIVE 06/19/2023 2000   HGBUR NEGATIVE 06/19/2023 2000   BILIRUBINUR NEGATIVE 06/19/2023 2000   BILIRUBINUR small (A) 09/17/2022 1802   KETONESUR NEGATIVE 06/19/2023 2000   PROTEINUR NEGATIVE 06/19/2023 2000   UROBILINOGEN 1.0 09/17/2022 1802   NITRITE NEGATIVE 06/19/2023 2000   LEUKOCYTESUR NEGATIVE 06/19/2023 2000   Radiological Exams on Admission: CT ABDOMEN PELVIS W CONTRAST Result Date: 06/19/2023 CLINICAL DATA:  Abdominal pain, acute, nonlocalized EXAM: CT ABDOMEN AND PELVIS WITH CONTRAST TECHNIQUE: Multidetector CT imaging of the abdomen and pelvis was performed using the standard protocol following bolus administration of intravenous contrast. RADIATION DOSE REDUCTION: This exam was performed according to the departmental dose-optimization program which includes automated exposure control, adjustment of the mA and/or kV according to patient size and/or use of iterative reconstruction technique. CONTRAST:  75mL OMNIPAQUE IOHEXOL 350 MG/ML SOLN COMPARISON:  None Available. FINDINGS: Lower chest: No acute abnormality. Hepatobiliary: No focal liver abnormality is seen. No gallstones, gallbladder wall thickening, or biliary dilatation. Pancreas: Unremarkable. No pancreatic ductal dilatation or surrounding  inflammatory changes. Spleen: Normal in size without focal abnormality. Adrenals/Urinary Tract: Adrenal glands are unremarkable. Kidneys are normal, without definitive renal calculi, focal lesion, or hydronephrosis. Bladder is unremarkable. Stomach/Bowel: No evidence of bowel obstruction. Appendix is normal. There is gastric fold and wall thickening. Colon is decompressed. Vascular/Lymphatic: No significant vascular findings are present. No enlarged abdominal or pelvic lymph nodes. Reproductive: Prostate is unremarkable. Other: No abdominal wall hernia or abnormality. No abdominopelvic ascites. Musculoskeletal: No acute or significant osseous findings. IMPRESSION: There is gastric fold and wall thickening. This is nonspecific and could reflect gastritis. Recommend correlation with symptoms. Electronically Signed   By: Clancy Crimes M.D.   On: 06/19/2023 20:56   Data Reviewed: Relevant notes from primary care and specialist visits, past discharge summaries as available in EHR, including Care Everywhere. Prior diagnostic testing as pertinent to current admission diagnoses, Updated medications and problem lists for reconciliation ED course, including vitals, labs, imaging, treatment and response to treatment,Triage notes, nursing and pharmacy notes and ED provider's notes Notable results as noted in HPI.Discussed case with EDMD/ ED APP/ or Specialty MD on call and as needed.  Assessment & Plan  >> Intractable nausea vomiting: Attributed to upper GI etiology of gastritis.  Suspect NSAID related. Would avoid ketorolac and any NSAIDs. Antiemetics as needed IV fluids, IV PPI at least for 3 months to 6 months. Aspiration precaution.   >> HIV: Will continue patient on her his Dovato . CMP with stable hypomagnesemia replaced.  >> Hypomagnesemia replaced: Chronic  DVT prophylaxis:  SCDs Consults:  None Advance Care Planning:    Code Status: Full Code   Family Communication:  None Disposition  Plan:  Home Severity of Illness: The appropriate patient status for this patient is INPATIENT. Inpatient status is judged to be reasonable and necessary in order to provide the required intensity of service to ensure the patient's safety. The patient's presenting symptoms, physical exam findings, and initial radiographic and laboratory data in the context of their chronic comorbidities is felt to place them at high risk for further clinical deterioration. Furthermore, it is not anticipated that the patient will be medically stable for discharge from the hospital within 2 midnights of admission.   * I certify that at the point of admission it is my clinical judgment that the patient will require  inpatient hospital care spanning beyond 2 midnights from the point of admission due to high intensity of service, high risk for further deterioration and high frequency of surveillance required.*  Unresulted Labs (From admission, onward)     Start     Ordered   06/21/23 0000  Comprehensive metabolic panel  Tomorrow morning,   R        06/20/23 0604   06/21/23 0000  CBC  Tomorrow morning,   R        06/20/23 0604            Orders Placed This Encounter  Procedures   CT ABDOMEN PELVIS W CONTRAST   Lipase, blood   Comprehensive metabolic panel   CBC   Urinalysis, Routine w reflex microscopic -Urine, Clean Catch   Rapid urine drug screen (hospital performed)   Comprehensive metabolic panel   Magnesium   Comprehensive metabolic panel   CBC   Diet clear liquid Room service appropriate? Yes; Fluid consistency: Thin   Maintain IV access   Vital signs   Notify physician (specify)   Mobility Protocol: No Restrictions RN to initiate protocols based on patient's level of care   Refer to Sidebar Report Refer to ICU, Med-Surg, Progressive, and Step-Down Mobility Protocol Sidebars   Initiate Adult Central Line Maintenance and Catheter Protocol for patients with central line (CVC, PICC, Port,  Hemodialysis, Trialysis)   Daily weights   Intake and Output   Do not place and if present remove PureWick   Initiate Oral Care Protocol   Initiate Carrier Fluid Protocol   RN may order General Admission PRN Orders utilizing "General Admission PRN medications" (through manage orders) for the following patient needs: allergy symptoms (Claritin), cold sores (Carmex), cough (Robitussin DM), eye irritation (Liquifilm Tears), hemorrhoids (Tucks), indigestion (Maalox), minor skin irritation (Hydrocortisone Cream), muscle pain Lovena Rubinstein Gay), nose irritation (saline nasal spray) and sore throat (Chloraseptic spray).   SCDs   Cardiac Monitoring Continuous x 48 hours Indications for use: Other; Other indications for use: Electrolyte monitoring as patient has nausea vomiting.   Full code   Consult to hospitalist   Pulse oximetry check with vital signs   Oxygen therapy Mode or (Route): Nasal cannula; Liters Per Minute: 2; Keep O2 saturation between: greater than 92 %   I-Stat CG4 Lactic Acid   ED EKG   EKG 12-Lead   Place in observation (patient's expected length of stay will be less than 2 midnights)   Aspiration precautions   Fall precautions    Author: Lavanda Porter, MD 12 pm -8 pm. 06/20/2023 6:04 AM >>Please note for any concern,or critical results after hours past 8pm please contact the [facility] floor coverage provider from 7 PM- 7 AM. For on call review [email or web address], username [email or web address] and PW: your phone number<<

## 2023-06-29 ENCOUNTER — Other Ambulatory Visit: Payer: Self-pay

## 2023-06-29 DIAGNOSIS — Z113 Encounter for screening for infections with a predominantly sexual mode of transmission: Secondary | ICD-10-CM

## 2023-06-29 DIAGNOSIS — Z21 Asymptomatic human immunodeficiency virus [HIV] infection status: Secondary | ICD-10-CM

## 2023-06-30 ENCOUNTER — Other Ambulatory Visit: Payer: Self-pay

## 2023-06-30 ENCOUNTER — Other Ambulatory Visit

## 2023-06-30 DIAGNOSIS — Z21 Asymptomatic human immunodeficiency virus [HIV] infection status: Secondary | ICD-10-CM

## 2023-06-30 DIAGNOSIS — Z113 Encounter for screening for infections with a predominantly sexual mode of transmission: Secondary | ICD-10-CM

## 2023-07-01 LAB — C. TRACHOMATIS/N. GONORRHOEAE RNA
C. trachomatis RNA, TMA: NOT DETECTED
N. gonorrhoeae RNA, TMA: NOT DETECTED

## 2023-07-02 LAB — T-HELPER CELLS (CD4) COUNT (NOT AT ARMC)
Absolute CD4: 543 {cells}/uL (ref 490–1740)
CD4 T Helper %: 32 % (ref 30–61)
Total lymphocyte count: 1722 {cells}/uL (ref 850–3900)

## 2023-07-02 LAB — HIV-1 RNA QUANT-NO REFLEX-BLD
HIV 1 RNA Quant: NOT DETECTED {copies}/mL
HIV-1 RNA Quant, Log: NOT DETECTED {Log_copies}/mL

## 2023-07-02 LAB — RPR: RPR Ser Ql: NONREACTIVE

## 2023-07-07 ENCOUNTER — Ambulatory Visit: Payer: Self-pay | Admitting: Infectious Diseases

## 2023-07-14 ENCOUNTER — Ambulatory Visit: Admitting: Infectious Diseases

## 2023-07-15 ENCOUNTER — Emergency Department (HOSPITAL_COMMUNITY)

## 2023-07-15 ENCOUNTER — Encounter (HOSPITAL_COMMUNITY): Payer: Self-pay

## 2023-07-15 ENCOUNTER — Other Ambulatory Visit: Payer: Self-pay

## 2023-07-15 ENCOUNTER — Emergency Department (HOSPITAL_COMMUNITY)
Admission: EM | Admit: 2023-07-15 | Discharge: 2023-07-15 | Disposition: A | Discharge status: Home / Self Care | Attending: Emergency Medicine | Admitting: Emergency Medicine

## 2023-07-15 DIAGNOSIS — Z21 Asymptomatic human immunodeficiency virus [HIV] infection status: Secondary | ICD-10-CM | POA: Insufficient documentation

## 2023-07-15 DIAGNOSIS — K529 Noninfective gastroenteritis and colitis, unspecified: Secondary | ICD-10-CM | POA: Diagnosis not present

## 2023-07-15 DIAGNOSIS — R112 Nausea with vomiting, unspecified: Secondary | ICD-10-CM | POA: Diagnosis present

## 2023-07-15 LAB — COMPREHENSIVE METABOLIC PANEL WITH GFR
ALT: 16 U/L (ref 0–44)
AST: 39 U/L (ref 15–41)
Albumin: 4.6 g/dL (ref 3.5–5.0)
Alkaline Phosphatase: 74 U/L (ref 38–126)
Anion gap: 12 (ref 5–15)
BUN: 17 mg/dL (ref 6–20)
CO2: 22 mmol/L (ref 22–32)
Calcium: 9.7 mg/dL (ref 8.9–10.3)
Chloride: 103 mmol/L (ref 98–111)
Creatinine, Ser: 1.19 mg/dL (ref 0.61–1.24)
GFR, Estimated: >60 mL/min (ref >60)
Glucose, Bld: 157 mg/dL — ABNORMAL HIGH (ref 70–99)
Potassium: 3.7 mmol/L (ref 3.5–5.1)
Sodium: 137 mmol/L (ref 135–145)
Total Bilirubin: 1.8 mg/dL — ABNORMAL HIGH (ref 0.0–1.2)
Total Protein: 7.9 g/dL (ref 6.5–8.1)

## 2023-07-15 LAB — LIPASE, BLOOD: Lipase: 30 U/L (ref 11–51)

## 2023-07-15 LAB — CBC
HCT: 46.5 % (ref 39.0–52.0)
Hemoglobin: 15.5 g/dL (ref 13.0–17.0)
MCH: 28 pg (ref 26.0–34.0)
MCHC: 33.3 g/dL (ref 30.0–36.0)
MCV: 83.9 fL (ref 80.0–100.0)
Platelets: 264 10*3/uL (ref 150–400)
RBC: 5.54 MIL/uL (ref 4.22–5.81)
RDW: 13.2 % (ref 11.5–15.5)
WBC: 9.8 10*3/uL (ref 4.0–10.5)
nRBC: 0 % (ref 0.0–0.2)

## 2023-07-15 MED ORDER — PANTOPRAZOLE SODIUM 40 MG IV SOLR
40.0000 mg | Freq: Once | INTRAVENOUS | Status: AC
  Administered 2023-07-15: 40 mg via INTRAVENOUS
  Filled 2023-07-15: qty 10

## 2023-07-15 MED ORDER — FENTANYL CITRATE PF 50 MCG/ML IJ SOSY
50.0000 ug | PREFILLED_SYRINGE | Freq: Once | INTRAMUSCULAR | Status: AC
  Administered 2023-07-15: 50 ug via INTRAVENOUS
  Filled 2023-07-15: qty 1

## 2023-07-15 MED ORDER — ONDANSETRON 4 MG PO TBDP: 4.0000 mg | ORAL_TABLET | Freq: Three times a day (TID) | ORAL | 0 refills | Status: AC | PRN

## 2023-07-15 MED ORDER — IOHEXOL 350 MG/ML SOLN
60.0000 mL | Freq: Once | INTRAVENOUS | Status: AC | PRN
  Administered 2023-07-15: 60 mL via INTRAVENOUS

## 2023-07-15 MED ORDER — ONDANSETRON HCL 4 MG/2ML IJ SOLN
4.0000 mg | Freq: Once | INTRAMUSCULAR | Status: AC | PRN
  Administered 2023-07-15: 4 mg via INTRAVENOUS
  Filled 2023-07-15: qty 2

## 2023-07-15 MED ORDER — HALOPERIDOL LACTATE 5 MG/ML IJ SOLN
5.0000 mg | Freq: Once | INTRAMUSCULAR | Status: AC
  Administered 2023-07-15: 5 mg via INTRAVENOUS
  Filled 2023-07-15: qty 1

## 2023-07-15 MED ORDER — SODIUM CHLORIDE 0.9 % IV BOLUS
1000.0000 mL | Freq: Once | INTRAVENOUS | Status: AC
  Administered 2023-07-15: 1000 mL via INTRAVENOUS

## 2023-07-15 MED ORDER — PROMETHAZINE HCL 25 MG PO TABS: 25.0000 mg | ORAL_TABLET | Freq: Four times a day (QID) | ORAL | 0 refills | Status: DC | PRN

## 2023-07-15 MED ORDER — ALUM & MAG HYDROXIDE-SIMETH 200-200-20 MG/5ML PO SUSP
30.0000 mL | Freq: Once | ORAL | Status: AC
  Administered 2023-07-15: 30 mL via ORAL
  Filled 2023-07-15: qty 30

## 2023-07-15 NOTE — ED Notes (Signed)
 Pt reports feeling much better and is ready to go.

## 2023-07-15 NOTE — ED Triage Notes (Addendum)
 Patient BIB EMS from home c/o nausea, vomiting, diarrhea with abdominal pain for 30 minutes. Actively vomiting in triage.

## 2023-07-15 NOTE — ED Provider Triage Note (Signed)
 Emergency Medicine Provider Triage Evaluation Note  Calvin Martinez  , a 23 y.o. male  was evaluated in triage.  Pt complains of abd pain. Diffused abd pain with n/v/d that started earlier today.  Sxs felt similar to gastritis that he was diagnosed when he was recently hospitalized for same.  Denies alcohol or drug use.  No recent sick contact  Review of Systems  Positive: As above Negative: As above  Physical Exam  BP 134/66   Pulse 69   Temp (!) 96.5 F (35.8 C) (Axillary)   Resp 17   Ht 6' 1 (1.854 m)   Wt 73.5 kg   SpO2 99% Comment: Simultaneous filing. User may not have seen previous data.  BMI 21.38 kg/m  Gen:   Awake, no distress   Resp:  Normal effort  MSK:   Moves extremities without difficulty  Other:    Medical Decision Making  Medically screening exam initiated at 6:19 PM.  Appropriate orders placed.  Calvin Martinez  was informed that the remainder of the evaluation will be completed by another provider, this initial triage assessment does not replace that evaluation, and the importance of remaining in the ED until their evaluation is complete.     Debbra Fairy, PA-C 07/15/23 1820

## 2023-07-15 NOTE — ED Notes (Signed)
 Pt given crackers and water per Dr.Curatolo order to this RN

## 2023-07-15 NOTE — Discharge Instructions (Signed)
 Overall take Zofran  as needed for nausea and vomiting, you can take Phenergan  for breakthrough nausea and vomiting that this medication is sedating.  Follow-up with your primary care doctor.  Return if symptoms worsen.  Please keep a simple diet for the next 24 hours and then advance as needed and as tolerated.  Recommend using mostly crackers and carbs.

## 2023-07-15 NOTE — ED Provider Notes (Signed)
 [facility] [place] EMERGENCY DEPARTMENT AT [facility] Provider Note   CSN: [id number] Arrival date & time: 07/15/23  1754     Patient presents with: Nausea and Emesis   Calvin Martinez  is a 23 y.o. male.   Patient here with abdominal pain nausea vomiting diarrhea that started today.  History of the same.  History of HIV compliant with his medications.  Denies any fever or chills.  No sick contacts.  No alcohol or drug use.  Recent hospitalization for the same.  Denies any chest pain shortness of breath weakness numbness tingling.  No recent antibiotics.  The history is provided by the patient.       Prior to Admission medications   Medication Sig Start Date End Date Taking? Authorizing Provider  ondansetron  (ZOFRAN -ODT) 4 MG disintegrating tablet Take 1 tablet (4 mg total) by mouth every 8 (eight) hours as needed for nausea or vomiting. 07/15/23  Yes Yarlin Breisch, DO  promethazine  (PHENERGAN ) 25 MG tablet Take 1 tablet (25 mg total) by mouth every 6 (six) hours as needed for nausea or vomiting. 07/15/23  Yes Meenakshi Sazama, DO  dolutegravir -lamiVUDine  (DOVATO ) 50-300 MG tablet Take 1 tablet by mouth daily. 02/08/23   Orson Blalock, NP  pantoprazole  (PROTONIX ) 40 MG tablet Take 1 tablet (40 mg total) by mouth daily. 06/20/23 09/18/23  Gonfa, Taye T, MD    Allergies: Patient has no known allergies.    Review of Systems  Updated Vital Signs BP 117/69   Pulse 85   Temp (!) 96.5 F (35.8 C) (Axillary)   Resp 17   Ht 6' 1 (1.854 m)   Wt 73.5 kg   SpO2 98%   BMI 21.38 kg/m   Physical Exam Vitals and nursing note reviewed.  Constitutional:      General: He is in acute distress.     Appearance: He is well-developed. He is ill-appearing.  HENT:     Head: Normocephalic and atraumatic.     Nose: Nose normal.     Mouth/Throat:     Mouth: Mucous membranes are moist.   Eyes:     Extraocular Movements: Extraocular movements intact.     Conjunctiva/sclera:  Conjunctivae normal.     Pupils: Pupils are equal, round, and reactive to light.    Cardiovascular:     Rate and Rhythm: Normal rate and regular rhythm.     Heart sounds: No murmur heard. Pulmonary:     Effort: Pulmonary effort is normal. No respiratory distress.     Breath sounds: Normal breath sounds.  Abdominal:     Palpations: Abdomen is soft.     Tenderness: There is abdominal tenderness.   Musculoskeletal:        General: No swelling.     Cervical back: Neck supple.   Skin:    General: Skin is warm and dry.     Capillary Refill: Capillary refill takes less than 2 seconds.   Neurological:     General: No focal deficit present.     Mental Status: He is alert.   Psychiatric:        Mood and Affect: Mood normal.     (all labs ordered are listed, but only abnormal results are displayed) Labs Reviewed  COMPREHENSIVE METABOLIC PANEL WITH GFR - Abnormal; Notable for the following components:      Result Value   Glucose, Bld 157 (*)    Total Bilirubin 1.8 (*)    All other components within normal limits  LIPASE,  BLOOD  CBC    EKG: None  Radiology: CT ABDOMEN PELVIS W CONTRAST Result Date: 07/15/2023 CLINICAL DATA:  Nausea, emesis, abdominal pain EXAM: CT ABDOMEN AND PELVIS WITH CONTRAST TECHNIQUE: Multidetector CT imaging of the abdomen and pelvis was performed using the standard protocol following bolus administration of intravenous contrast. RADIATION DOSE REDUCTION: This exam was performed according to the departmental dose-optimization program which includes automated exposure control, adjustment of the mA and/or kV according to patient size and/or use of iterative reconstruction technique. CONTRAST:  60mL OMNIPAQUE  IOHEXOL  350 MG/ML SOLN COMPARISON:  CT abdomen pelvis 06/19/2023 FINDINGS: Lower chest: No acute abnormality. Hepatobiliary: Unremarkable. Pancreas: Unremarkable. Spleen: Unremarkable. Adrenals/Urinary Tract: Normal adrenal glands and kidneys.  Bladder.  Stomach/Bowel: No bowel obstruction. Fluid-filled small bowel with possible mild thickening of the wall and valvulae conniventes. The stomach is decompressed. The previous stomach wall thickening is less conspicuous now and within normal limits given degree of distention. Normal appendix. Vascular/Lymphatic: No significant vascular findings are present. No enlarged abdominal or pelvic lymph nodes. Reproductive: Unremarkable. Other: No free intraperitoneal fluid or air. Musculoskeletal: No acute fracture. IMPRESSION: Fluid-filled small bowel with possible mild thickening of the wall and valvulae conniventes. Findings are nonspecific but can be seen in the setting of enteritis. Electronically Signed   By: Rozell Cornet M.D.   On: 07/15/2023 20:21     Procedures   Medications Ordered in the ED  ondansetron  (ZOFRAN ) injection 4 mg (4 mg Intravenous Given 07/15/23 1803)  alum & mag hydroxide-simeth (MAALOX/MYLANTA) 200-200-20 MG/5ML suspension 30 mL (30 mLs Oral Given 07/15/23 1830)  sodium chloride  0.9 % bolus 1,000 mL (1,000 mLs Intravenous New Bag/Given 07/15/23 1926)  haloperidol lactate (HALDOL) injection 5 mg (5 mg Intravenous Given 07/15/23 1926)  fentaNYL (SUBLIMAZE) injection 50 mcg (50 mcg Intravenous Given 07/15/23 1941)  pantoprazole  (PROTONIX ) injection 40 mg (40 mg Intravenous Given 07/15/23 2038)  iohexol  (OMNIPAQUE ) 350 MG/ML injection 60 mL (60 mLs Intravenous Contrast Given 07/15/23 2013)                                    Medical Decision Making Amount and/or Complexity of Data Reviewed Radiology: ordered.  Risk Prescription drug management.   Calvin Martinez  is here with nausea vomit diarrhea abdominal pain.  Normal vitals.  No fever.  Differential diagnosis likely colitis versus gastritis versus less likely pancreatitis, bowel obstruction, cholecystitis or appendicitis.  Denies any alcohol or drug use.  Recent admission for the same.  UDS negative in the past.  No history  of diabetes.  He is compliant with HIV medications.  His last HIV quant and CD4 counts were normal.  No fever no chills.  Will get CBC CMP lipase urinalysis CT scan abdomen and pelvis.  Will give IV fluids IV Haldol IV fentanyl and reevaluate.  Will give IV Protonix  as well.  Overall lab work shows no significant electrolyte abnormality kidney injury or leukocytosis.  No significant anemia.  I reviewed interpreted labs and imaging.  CT scan per radiology report shows may be some enteritis with some nonspecific thickening of the small bowel.  On reexamination he is feeling better.  I do think this could be likely viral or foodborne related process.  But he does have somewhat recurrent episodes of this.  Will see how he does with the p.o. challenge.  He is feeling better but somewhat still uneasy.  Patient feeling better after p.o. challenge.  He would like to be discharged home.  Will prescribe Zofran  and Phenergan  to use as needed.  Phenergan  mildly sedating so he was given the option to take either or.  Overall understands return precautions.  Discharge.  Close follow-up with primary care doctor recommended.  This chart was dictated using voice recognition software.  Despite best efforts to proofread,  errors can occur which can change the documentation meaning.      Final diagnoses:  Enteritis    ED Discharge Orders          Ordered    ondansetron  (ZOFRAN -ODT) 4 MG disintegrating tablet  Every 8 hours PRN        07/15/23 2157    promethazine  (PHENERGAN ) 25 MG tablet  Every 6 hours PRN        07/15/23 2157               Lowery Rue, DO 07/15/23 2202

## 2023-07-28 ENCOUNTER — Other Ambulatory Visit: Payer: Self-pay

## 2023-07-28 ENCOUNTER — Ambulatory Visit: Admitting: Infectious Diseases

## 2023-07-28 ENCOUNTER — Encounter: Payer: Self-pay | Admitting: Infectious Diseases

## 2023-07-28 DIAGNOSIS — Z21 Asymptomatic human immunodeficiency virus [HIV] infection status: Secondary | ICD-10-CM | POA: Diagnosis present

## 2023-07-28 DIAGNOSIS — R7401 Elevation of levels of liver transaminase levels: Secondary | ICD-10-CM

## 2023-07-28 DIAGNOSIS — Z23 Encounter for immunization: Secondary | ICD-10-CM

## 2023-07-28 MED ORDER — DOLUTEGRAVIR-LAMIVUDINE 50-300 MG PO TABS: 1.0000 | ORAL_TABLET | Freq: Every day | ORAL | 11 refills | Status: DC

## 2023-07-28 NOTE — Patient Instructions (Signed)
 Please continue the dovato  once daily   We gave you 2 vaccines today   Please schedule a follow up appointment in 3 months - we can do a lab visit a week before your appointment to make sure the one hep b booster was enough for you. Please schedule these visits accordingly.

## 2023-07-28 NOTE — Progress Notes (Signed)
 Name: Calvin Martinez   DOB: 12-Dec-2000 MRN: [id number] PCP: Pcp, No    Brief Narrative:  Calvin Martinez  is a 23 y.o. male with HIV, Stage 2, diagnosed on 12-16-22. CD4 nadir 311 VL 297,000 Transmission Risk: sexual/remote msm History of OIs: none History of STIs: Hep B sAg (-), sAb (-), cAb (-); Hep A (-), Hep C (-) Quantiferon (-) Toxo IgG ( - ) CMV IgG ( + )  Previous Regimens: Dovato  12-2022  Genotypes: 12-2022 - wildtype  Subjective   Subjective:   Chief Complaint  Patient presents with   Follow-up    B20    Discussed the use of AI scribe software for clinical note transcription with the patient, who gave verbal consent to proceed.  History of Present Illness   Calvin Martinez  is a 23 year old male with HIV who presents for follow-up after a recent hospitalization for gastroenteritis.  Approximately one month ago, he was hospitalized due to symptoms consistent with gastroenteritis, including vomiting and diarrhea. Since then, he has recovered, regaining his appetite and returning to normal health. He did not experience significant weight loss despite the vomiting and diarrhea. He recalls shivering in the hospital but did not have fevers or chills once covered. A CT scan during his hospital stay showed thickening in the lower intestines, but no other abnormalities. Blood tests revealed a slight elevation in total bilirubin, which has been a consistent finding in past tests.  He is living with HIV and is currently on Dovato , which he tolerates well without any issues. His recent blood work showed a CD4 count of 543, indicating recovery to the normal range, and his viral load remains undetectable. He has moved recently and requested to transfer his prescription to a new pharmacy.  Regarding his vaccination history, he is unsure about his past vaccinations, including the HPV vaccine. He was born in [place]  and recalls receiving some childhood  vaccinations. No current symptoms of vomiting, diarrhea, or fever. He denies any sexual health concerns and states his mood is okay.           07/28/2023    3:02 PM  Depression screen PHQ 2/9  Decreased Interest 0  Down, Depressed, Hopeless 0  PHQ - 2 Score 0  Altered sleeping 0  Tired, decreased energy 0  Change in appetite 0  Feeling bad or failure about yourself  0  Trouble concentrating 0  Moving slowly or fidgety/restless 0  Suicidal thoughts 0  PHQ-9 Score 0    Review of Systems  Constitutional:  Negative for chills, fever, malaise/fatigue and weight loss.  HENT:  Negative for sore throat.        No dental problems  Respiratory:  Negative for cough and sputum production.   Cardiovascular:  Negative for chest pain and leg swelling.  Gastrointestinal:  Negative for abdominal pain, diarrhea and vomiting.  Genitourinary:  Negative for dysuria and flank pain.  Musculoskeletal:  Negative for joint pain, myalgias and neck pain.  Skin:  Negative for rash.  Neurological:  Negative for dizziness, tingling and headaches.  Psychiatric/Behavioral:  Positive for depression. Negative for substance abuse and suicidal ideas. The patient is not nervous/anxious and does not have insomnia.      Past Medical History:  Diagnosis Date   HIV-1 (human immunodeficiency virus I) (HCC) 12/22/2022    Outpatient Medications Prior to Visit  Medication Sig Dispense Refill   ondansetron  (ZOFRAN -ODT) 4 MG disintegrating tablet Take 1 tablet (4 mg total) by  mouth every 8 (eight) hours as needed for nausea or vomiting. 20 tablet 0   dolutegravir -lamiVUDine  (DOVATO ) 50-300 MG tablet Take 1 tablet by mouth daily. 30 tablet 11   pantoprazole  (PROTONIX ) 40 MG tablet Take 1 tablet (40 mg total) by mouth daily. (Patient not taking: Reported on 07/28/2023) 90 tablet 0   promethazine  (PHENERGAN ) 25 MG tablet Take 1 tablet (25 mg total) by mouth every 6 (six) hours as needed for nausea or vomiting. (Patient  not taking: Reported on 07/28/2023) 30 tablet 0   No facility-administered medications prior to visit.     No Known Allergies  Social History   Tobacco Use   Smoking status: Never    Passive exposure: Never   Smokeless tobacco: Never  Vaping Use   Vaping status: Never Used  Substance Use Topics   Alcohol use: Never   Drug use: Never    Family History  Problem Relation Age of Onset   Healthy Mother    Healthy Father     Social History   Substance and Sexual Activity  Sexual Activity Not Currently   Comment: declined comdoms       Objective   Objective:   There were no vitals filed for this visit.  There is no height or weight on file to calculate BMI.  Physical Exam Vitals reviewed.  Constitutional:      Appearance: Normal appearance. He is not ill-appearing.  HENT:     Head: Normocephalic.     Mouth/Throat:     Mouth: Mucous membranes are moist.     Pharynx: Oropharynx is clear.   Eyes:     General: No scleral icterus.   Cardiovascular:     Rate and Rhythm: Normal rate and regular rhythm.  Pulmonary:     Effort: Pulmonary effort is normal.   Musculoskeletal:        General: Normal range of motion.     Cervical back: Normal range of motion.   Skin:    Coloration: Skin is not jaundiced or pale.   Neurological:     Mental Status: He is alert and oriented to person, place, and time.   Psychiatric:        Mood and Affect: Mood normal.        Judgment: Judgment normal.        Assessment & Plan:  Assessment and Plan    HIV infection,  VL < 20, CD4 > 500 -  HIV infection with undetectable viral load. CD4 count is within normal range at 543. Well-managed on Dovato . Emphasized the importance of maintaining undetectable viral load to prevent transmission to partners. - Send new prescription for Dovato  to [facility] on [address], [employer] - Administer hepatitis B booster vaccine. - Administer Prevnar vaccine. - Check blood work in a  couple of months to assess need for additional hepatitis B booster. - Follow up in 3 months.  Gastroenteritis - Resolved episode of gastroenteritis approximately one month ago with symptoms of vomiting and diarrhea. CT scan showed thickening in the lower intestines consistent with enteritis. No significant weight loss or fever reported. Symptoms have resolved with return of appetite and normal function.  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 Liver function otherwise normal and CT scan unremarkable. Condition is not concerning and will be monitored with future blood work. - Monitor bilirubin levels in future blood work.  General Health Maintenance - Discussed the importance of vaccinations in the context of  HIV. Recommended HPV and hepatitis B vaccines. Confirmed need for hepatitis B booster and Prevnar vaccine. Discussed potential need for meningitis vaccine based on records review. - Review vaccination records for HPV and meningitis vaccines - Needs HPV and Menveo  - Consider HPV vaccination series if not previously completed. - Hep B booster today and prevnar 20  - Only received 1 dose of varicella - check IgG next lab draw    Orders Placed This Encounter  Procedures   Measles/Mumps/Rubella Immunity    Standing Status:   Future    Expected Date:   10/28/2023    Expiration Date:   01/26/2024   Hepatitis B surface antibody,qualitative    Standing Status:   Future    Expected Date:   10/28/2023    Expiration Date:   01/26/2024   HIV 1 RNA quant-no reflex-bld    Standing Status:   Future    Expected Date:   10/28/2023    Expiration Date:   01/26/2024    Meds ordered this encounter  Medications   dolutegravir -lamiVUDine  (DOVATO ) 50-300 MG tablet    Sig: Take 1 tablet by mouth daily.    Dispense:  30 tablet    Refill:  11    Return in about 3 months (around 10/28/2023).   Corean Fireman, MSN, NP-C [facility] for  Infectious Disease [facility]  [email or web address] Pager: [phone] Office: [phone] [facility] Main Line: [phone] *Secure Chat Communication Welcome

## 2023-07-28 NOTE — Addendum Note (Signed)
 Addended by: VEVA MOTTS T on: 07/28/2023 04:50 PM   Modules accepted: Orders

## 2023-09-23 ENCOUNTER — Telehealth: Payer: Self-pay

## 2023-09-23 ENCOUNTER — Other Ambulatory Visit (HOSPITAL_COMMUNITY): Payer: Self-pay

## 2023-09-23 NOTE — Telephone Encounter (Signed)
 [facility] Patient Advocate Encounter ? ?Insurance verification completed.   ? ?The patient is uninsured and will need patient assistance for medication. ? ?We can complete the application and will need to meet with the patient for signatures and income documentation. ? ?Arland Hutchinson, CPhT ?Specialty Pharmacy Patient Advocate ?[facility] ?Phone: [phone] ?Fax:  [phone]  ?

## 2023-09-28 ENCOUNTER — Other Ambulatory Visit (HOSPITAL_COMMUNITY): Payer: Self-pay

## 2023-09-30 ENCOUNTER — Other Ambulatory Visit: Payer: Self-pay | Admitting: Pharmacist

## 2023-09-30 DIAGNOSIS — Z21 Asymptomatic human immunodeficiency virus [HIV] infection status: Secondary | ICD-10-CM

## 2023-09-30 MED ORDER — BICTEGRAVIR-EMTRICITAB-TENOFOV 50-200-25 MG PO TABS: 1.0000 | ORAL_TABLET | Freq: Every day | ORAL | Status: AC

## 2023-09-30 MED ORDER — DOVATO 50-300 MG PO TABS: 1.0000 | ORAL_TABLET | Freq: Every day | ORAL | Status: AC

## 2023-09-30 NOTE — Progress Notes (Signed)
 Medication Samples have been provided to the patient.  Drug name: Dovato         Strength: 50/300 mg         Qty: 14  Tablets (1 bottles) LOT: 667G   Exp.Date: 9/26  Samples requested by Arland Hutchinson.  Dosing instructions: Take one tablet by mouth once daily  The patient has been instructed regarding the correct time, dose, and frequency of taking this medication, including desired effects and most common side effects.   Alan Geralds, PharmD, CPP, BCIDP, AAHIVP Clinical Pharmacist Practitioner Infectious Diseases Clinical Pharmacist [facility]

## 2023-09-30 NOTE — Progress Notes (Signed)
 Medication Samples have been provided to the patient.  Drug name: Biktarvy        Strength: 50/200/25 mg       Qty: 7 tablets (1 bottles) LOT: [id number]   Exp.Date: 7/27  Samples requested by Charmaine Sharps. Staff aware that incorrect medication was provided as patient takes Dovato  daily. Confirmed than switching one week to [facility] will not cause patient harm.   Dosing instructions: Take one tablet by mouth once daily  The patient has been instructed regarding the correct time, dose, and frequency of taking this medication, including desired effects and most common side effects.   Alan Geralds, PharmD, CPP, BCIDP, AAHIVP Clinical Pharmacist Practitioner Infectious Diseases Clinical Pharmacist [facility] for Infectious Disease

## 2023-10-01 ENCOUNTER — Other Ambulatory Visit: Payer: Self-pay

## 2023-10-01 ENCOUNTER — Encounter: Payer: Self-pay | Admitting: Infectious Diseases

## 2023-10-01 ENCOUNTER — Ambulatory Visit (INDEPENDENT_AMBULATORY_CARE_PROVIDER_SITE_OTHER): Payer: Self-pay | Admitting: Infectious Diseases

## 2023-10-01 VITALS — BP 105/63 | HR 57 | Temp 98.4°F | Ht 74.0 in | Wt 167.0 lb

## 2023-10-01 DIAGNOSIS — Z1159 Encounter for screening for other viral diseases: Secondary | ICD-10-CM

## 2023-10-01 DIAGNOSIS — Z21 Asymptomatic human immunodeficiency virus [HIV] infection status: Secondary | ICD-10-CM

## 2023-10-01 DIAGNOSIS — F4389 Other reactions to severe stress: Secondary | ICD-10-CM

## 2023-10-01 DIAGNOSIS — Z23 Encounter for immunization: Secondary | ICD-10-CM

## 2023-10-01 NOTE — Progress Notes (Signed)
 Name: Calvin Martinez   DOB: [birth year] MRN: [id number] PCP: Pcp, No    Brief Narrative:  Calvin Martinez  is a 23 y.o. male with HIV, Stage 2, diagnosed on 12-16-22. CD4 nadir 311 VL 297,000 Transmission Risk: sexual/remote msm History of OIs: none History of STIs: Hep B sAg (-), sAb (-), cAb (-); Hep A (-), Hep C (-) Quantiferon (-) Toxo IgG ( - ) CMV IgG ( + )  Previous Regimens: Dovato  12-2022  Genotypes: 12-2022 - wildtype  Subjective   Subjective:   Chief Complaint  Patient presents with   Follow-up    Discussed the use of AI scribe software for clinical note transcription with the patient, who gave verbal consent to proceed.  History of Present Illness   Calvin Martinez  is a 23 year old male with HIV who presents for a two-month follow-up for HIV care.  He is currently on Dovato , having some issues with medication access. Over income for RW program and working on patient assitsance through [place]. No health insurance otherwise at the moment. He was able to obtain a 14-day supply of Dovato  through samples.    He indicatd high scores on PHQ9 and GAD 7 - he clarified that there are no suicidal thoughts or depression. No history of depression requiring hospitalization. He is very stressed over not getting his lifesaving medication and the trouble he has been going through.   His last blood work three months ago showed an undetectable viral load and an improved CD4 count from 300 to 550. He is in the process of receiving vaccinations, having already received his first dose of the hepatitis B vaccine in June.  He is currently working in [employer], which involves a lot of physical activity. He is busy and does not have much time for leisure activities. His sleep is irregular, which he attributes to his schedule. No substance use, including drugs, alcohol, or marijuana. No current sexual health concerns.           10/01/2023   10:54 AM  Depression  screen PHQ 2/9  Decreased Interest 0  Down, Depressed, Hopeless 3  PHQ - 2 Score 3  Altered sleeping 0  Tired, decreased energy 3  Change in appetite 0  Feeling bad or failure about yourself  3  Trouble concentrating 3  Moving slowly or fidgety/restless 0  Suicidal thoughts 3  PHQ-9 Score 15  Difficult doing work/chores Somewhat difficult    Review of Systems  Constitutional:  Negative for chills, fever, malaise/fatigue and weight loss.  HENT:  Negative for sore throat.        No dental problems  Respiratory:  Negative for cough and sputum production.   Cardiovascular:  Negative for chest pain and leg swelling.  Gastrointestinal:  Negative for abdominal pain, diarrhea and vomiting.  Genitourinary:  Negative for dysuria and flank pain.  Musculoskeletal:  Negative for joint pain, myalgias and neck pain.  Skin:  Negative for rash.  Neurological:  Negative for dizziness, tingling and headaches.  Psychiatric/Behavioral:  Positive for depression. Negative for substance abuse and suicidal ideas. The patient is not nervous/anxious and does not have insomnia.      Past Medical History:  Diagnosis Date   HIV-1 (human immunodeficiency virus I) (HCC) 12/22/2022    Outpatient Medications Prior to Visit  Medication Sig Dispense Refill   dolutegravir -lamiVUDine  (DOVATO ) 50-300 MG tablet Take 1 tablet by mouth daily for 14 days.     ondansetron  (ZOFRAN -ODT) 4 MG disintegrating tablet  Take 1 tablet (4 mg total) by mouth every 8 (eight) hours as needed for nausea or vomiting. 20 tablet 0   dolutegravir -lamiVUDine  (DOVATO ) 50-300 MG tablet Take 1 tablet by mouth daily. 30 tablet 11   No facility-administered medications prior to visit.     No Known Allergies  Social History   Tobacco Use   Smoking status: Never    Passive exposure: Never   Smokeless tobacco: Never  Vaping Use   Vaping status: Never Used  Substance Use Topics   Alcohol use: Never   Drug use: Never    Family  History  Problem Relation Age of Onset   Healthy Mother    Healthy Father     Social History   Substance and Sexual Activity  Sexual Activity Not Currently   Comment: declined comdoms       Objective   Objective:   Vitals:   10/01/23 1049  BP: 105/63  Pulse: (!) 57  Temp: 98.4 F (36.9 C)  TempSrc: Temporal  SpO2: 96%  Weight: 167 lb (75.8 kg)  Height: 6' 2 (1.88 m)    Body mass index is 21.44 kg/m.  Physical Exam Vitals reviewed.  Constitutional:      Appearance: Normal appearance. He is not ill-appearing.  HENT:     Head: Normocephalic.     Mouth/Throat:     Mouth: Mucous membranes are moist.     Pharynx: Oropharynx is clear.  Eyes:     General: No scleral icterus. Cardiovascular:     Rate and Rhythm: Normal rate and regular rhythm.  Pulmonary:     Effort: Pulmonary effort is normal.  Musculoskeletal:        General: Normal range of motion.     Cervical back: Normal range of motion.  Skin:    Coloration: Skin is not jaundiced or pale.  Neurological:     Mental Status: He is alert and oriented to person, place, and time.  Psychiatric:        Mood and Affect: Mood normal.        Judgment: Judgment normal.        Assessment & Plan:     HIV infection - HIV infection is well-controlled with undetectable viral load and improved CD4 count from 300 to 550. Significant medication access issues due to insurance and income constraints are causing psychological stress. Currently on Dovato  with a 14-day supply available, awaiting patient assistance approval, which may take up to four business days. - Continue Dovato  as prescribed - Provide additional samples of Dovato  if needed to bridge until patient assistance approval - Check with patient assistance for approval status - Order HIV viral load test  Psychological stress related to medication access - Experiencing psychological stress primarily due to medication access issues. No depression or suicidal  ideation. Stress is related to the uncertainty of obtaining life-saving medication. - Discuss potential for counseling or medication if mood worsens  Hepatitis B immunization, in progress - Hepatitis B immunization is in progress. The first dose was administered in June, and the second and final dose is due today. - Administer second dose of hepatitis B vaccine  HPV immunization, planned - HPV immunization is planned to prevent potential HPV-related conditions such as genital warts and certain cancers. The vaccine series consists of three shots over six months. - Administer first dose of HPV vaccine       Orders Placed This Encounter  Procedures   HPV 9-valent vaccine,Recombinat   Heplisav-B  (HepB-CPG) Vaccine  HIV 1 RNA quant-no reflex-bld   Measles/Mumps/Rubella Immunity   T-helper cells (CD4) count    No orders of the defined types were placed in this encounter.   Return in about 6 months (around 04/01/2024).   Corean Fireman, MSN, NP-C [facility]  [email or web address] Pager: [phone] Office: [phone] [facility] Main Line: [phone] *Secure Chat Communication Welcome

## 2023-10-01 NOTE — Patient Instructions (Signed)
 Please come back in 4-6 months for routine check in with me   Will have you back for a nurse visit to get your second HPV shot in 2 months - please schedule this appointment as well.

## 2023-10-04 LAB — MEASLES/MUMPS/RUBELLA IMMUNITY
Mumps IgG: >300 [AU]/ml
Rubella: 4.78 {index}
Rubeola IgG: >300 [AU]/ml

## 2023-10-04 LAB — T-HELPER CELLS (CD4) COUNT (NOT AT ARMC)
Absolute CD4: 551 {cells}/uL (ref 490–1740)
CD4 T Helper %: 37 % (ref 30–61)
Total lymphocyte count: 1485 {cells}/uL (ref 850–3900)

## 2023-10-04 LAB — HIV-1 RNA QUANT-NO REFLEX-BLD
HIV 1 RNA Quant: NOT DETECTED {copies}/mL
HIV-1 RNA Quant, Log: NOT DETECTED {Log_copies}/mL

## 2023-10-05 ENCOUNTER — Telehealth: Payer: Self-pay

## 2023-10-05 ENCOUNTER — Other Ambulatory Visit: Payer: Self-pay | Admitting: Pharmacist

## 2023-10-05 DIAGNOSIS — Z21 Asymptomatic human immunodeficiency virus [HIV] infection status: Secondary | ICD-10-CM

## 2023-10-05 MED ORDER — DOLUTEGRAVIR-LAMIVUDINE 50-300 MG PO TABS
1.0000 | ORAL_TABLET | Freq: Every day | ORAL | 3 refills | Status: AC
Start: 2023-10-05 — End: ?

## 2023-10-05 NOTE — Telephone Encounter (Signed)
 [facility] Patient Advocate Encounter  Completed and sent [employer] application for DOVATO  for this patient who is uninsured.    Patient is approved 10/05/23 through 10/04/24.   Charmaine Sharps, CPhT Specialty Pharmacy Patient [facility] for Infectious Disease Phone: [phone] Fax:  [phone]

## 2023-10-11 ENCOUNTER — Ambulatory Visit: Payer: Self-pay | Admitting: Infectious Diseases

## 2023-10-14 ENCOUNTER — Other Ambulatory Visit: Payer: Self-pay | Admitting: Pharmacist

## 2023-10-14 DIAGNOSIS — Z21 Asymptomatic human immunodeficiency virus [HIV] infection status: Secondary | ICD-10-CM

## 2023-10-14 MED ORDER — DOVATO 50-300 MG PO TABS: 1.0000 | ORAL_TABLET | Freq: Every day | ORAL | Status: AC

## 2023-10-14 NOTE — Progress Notes (Signed)
 Medication Samples have been provided to the patient.  Drug name: Dovato         Strength: 50/300 mg         Qty: 14  Tablets (1 bottles) LOT: [id number]   Exp.Date: 9/26  Samples requested by Charmaine Sharps - [facility] pharmacy delay.  Dosing instructions: Take one tablet by mouth once daily  The patient has been instructed regarding the correct time, dose, and frequency of taking this medication, including desired effects and most common side effects.   Alan Geralds, PharmD, CPP, BCIDP, AAHIVP Clinical Pharmacist Practitioner Infectious Diseases Clinical Pharmacist [facility] for Infectious Disease

## 2023-10-19 ENCOUNTER — Telehealth: Payer: Self-pay

## 2023-10-19 NOTE — Telephone Encounter (Signed)
 [facility] Patient Advocate Encounter  Patient's medications DOVATO  have been couriered to [facility] from [facility] [employer] and will be picked-up at [facility].  Charmaine Sharps, CPhT Specialty Pharmacy Patient [facility] for Infectious Disease Phone: [phone] Fax:  [phone]

## 2023-10-26 ENCOUNTER — Telehealth: Payer: Self-pay

## 2023-10-26 NOTE — Telephone Encounter (Signed)
 Medication was picked up at [facility] on 10/26/23 @ 1:00pm   3 MONTHS SUPPLY FROM [facility]

## 2023-12-17 ENCOUNTER — Encounter: Payer: Medicaid Other | Admitting: Family Medicine
